# Patient Record
Sex: Female | Born: 1935 | Race: White | Hispanic: No | Smoking: Current every day smoker
Health system: Southern US, Community
[De-identification: ages and names within clinical notes are randomized; demographics above are authoritative.]

## PROBLEM LIST (undated history)

## (undated) DIAGNOSIS — C801 Malignant (primary) neoplasm, unspecified: Secondary | ICD-10-CM

## (undated) DIAGNOSIS — I428 Other cardiomyopathies: Secondary | ICD-10-CM

## (undated) DIAGNOSIS — M1990 Unspecified osteoarthritis, unspecified site: Secondary | ICD-10-CM

## (undated) DIAGNOSIS — I4891 Unspecified atrial fibrillation: Secondary | ICD-10-CM

## (undated) DIAGNOSIS — I2510 Atherosclerotic heart disease of native coronary artery without angina pectoris: Secondary | ICD-10-CM

## (undated) HISTORY — PX: ABDOMINAL HYSTERECTOMY: SHX81

---

## 2001-06-21 ENCOUNTER — Emergency Department (HOSPITAL_COMMUNITY): Admission: EM | Admit: 2001-06-21 | Discharge: 2001-06-21 | Payer: Self-pay | Admitting: Emergency Medicine

## 2001-06-21 ENCOUNTER — Encounter: Payer: Self-pay | Admitting: Emergency Medicine

## 2002-07-21 ENCOUNTER — Encounter: Payer: Self-pay | Admitting: Internal Medicine

## 2002-07-21 ENCOUNTER — Observation Stay (HOSPITAL_COMMUNITY): Admission: EM | Admit: 2002-07-21 | Discharge: 2002-07-21 | Payer: Self-pay | Admitting: *Deleted

## 2003-03-13 ENCOUNTER — Encounter (HOSPITAL_COMMUNITY): Admission: RE | Admit: 2003-03-13 | Discharge: 2003-04-12 | Payer: Self-pay | Admitting: *Deleted

## 2003-07-30 ENCOUNTER — Ambulatory Visit: Admission: RE | Admit: 2003-07-30 | Discharge: 2003-07-30 | Payer: Self-pay | Admitting: Orthopedic Surgery

## 2003-07-30 ENCOUNTER — Encounter: Payer: Self-pay | Admitting: Orthopedic Surgery

## 2003-08-14 ENCOUNTER — Ambulatory Visit (HOSPITAL_COMMUNITY): Admission: RE | Admit: 2003-08-14 | Discharge: 2003-08-14 | Payer: Self-pay | Admitting: Orthopedic Surgery

## 2003-08-14 ENCOUNTER — Encounter: Payer: Self-pay | Admitting: Orthopedic Surgery

## 2006-06-08 ENCOUNTER — Ambulatory Visit: Payer: Self-pay | Admitting: Internal Medicine

## 2006-06-27 ENCOUNTER — Encounter (INDEPENDENT_AMBULATORY_CARE_PROVIDER_SITE_OTHER): Payer: Self-pay | Admitting: Specialist

## 2006-06-27 ENCOUNTER — Ambulatory Visit: Payer: Self-pay | Admitting: Internal Medicine

## 2006-06-27 ENCOUNTER — Ambulatory Visit (HOSPITAL_COMMUNITY): Admission: RE | Admit: 2006-06-27 | Discharge: 2006-06-27 | Payer: Self-pay | Admitting: Internal Medicine

## 2007-03-15 ENCOUNTER — Ambulatory Visit: Payer: Self-pay | Admitting: Orthopedic Surgery

## 2007-08-13 ENCOUNTER — Ambulatory Visit: Payer: Self-pay | Admitting: Orthopedic Surgery

## 2009-06-07 ENCOUNTER — Encounter (INDEPENDENT_AMBULATORY_CARE_PROVIDER_SITE_OTHER): Payer: Self-pay | Admitting: Internal Medicine

## 2009-06-07 ENCOUNTER — Inpatient Hospital Stay (HOSPITAL_COMMUNITY): Admission: EM | Admit: 2009-06-07 | Discharge: 2009-06-13 | Payer: Self-pay | Admitting: Emergency Medicine

## 2009-11-21 HISTORY — PX: TOTAL HIP ARTHROPLASTY: SHX124

## 2010-08-01 ENCOUNTER — Inpatient Hospital Stay (HOSPITAL_COMMUNITY): Admission: EM | Admit: 2010-08-01 | Discharge: 2010-08-11 | Payer: Self-pay | Admitting: Emergency Medicine

## 2010-08-05 ENCOUNTER — Encounter: Payer: Self-pay | Admitting: Orthopedic Surgery

## 2010-08-11 ENCOUNTER — Inpatient Hospital Stay: Admission: AD | Admit: 2010-08-11 | Discharge: 2010-08-13 | Payer: Self-pay | Admitting: Internal Medicine

## 2010-09-21 ENCOUNTER — Encounter (HOSPITAL_COMMUNITY)
Admission: RE | Admit: 2010-09-21 | Discharge: 2010-10-21 | Payer: Self-pay | Source: Home / Self Care | Admitting: Orthopedic Surgery

## 2010-10-06 ENCOUNTER — Ambulatory Visit: Payer: Self-pay | Admitting: Psychiatry

## 2011-02-03 LAB — DIFFERENTIAL
Basophils Absolute: 0 10*3/uL (ref 0.0–0.1)
Basophils Relative: 0 % (ref 0–1)
Basophils Relative: 0 % (ref 0–1)
Eosinophils Absolute: 0.1 10*3/uL (ref 0.0–0.7)
Eosinophils Relative: 0 % (ref 0–5)
Lymphocytes Relative: 9 % — ABNORMAL LOW (ref 12–46)
Lymphocytes Relative: 18 % (ref 12–46)
Lymphs Abs: 1.3 10*3/uL (ref 0.7–4.0)
Lymphs Abs: 1.8 10*3/uL (ref 0.7–4.0)
Lymphs Abs: 2.1 10*3/uL (ref 0.7–4.0)
Monocytes Absolute: 1.1 10*3/uL — ABNORMAL HIGH (ref 0.1–1.0)
Monocytes Relative: 4 % (ref 3–12)
Monocytes Relative: 10 % (ref 3–12)
Neutro Abs: 13.5 10*3/uL — ABNORMAL HIGH (ref 1.7–7.7)
Neutro Abs: 7.9 10*3/uL — ABNORMAL HIGH (ref 1.7–7.7)
Neutro Abs: 8.1 10*3/uL — ABNORMAL HIGH (ref 1.7–7.7)
Neutrophils Relative %: 87 % — ABNORMAL HIGH (ref 43–77)
Neutrophils Relative %: 72 % (ref 43–77)

## 2011-02-03 LAB — BASIC METABOLIC PANEL
BUN: 4 mg/dL — ABNORMAL LOW (ref 6–23)
BUN: 5 mg/dL — ABNORMAL LOW (ref 6–23)
BUN: 5 mg/dL — ABNORMAL LOW (ref 6–23)
BUN: 1 mg/dL — ABNORMAL LOW (ref 6–23)
CO2: 27 mEq/L (ref 19–32)
CO2: 29 mEq/L (ref 19–32)
Calcium: 8.4 mg/dL (ref 8.4–10.5)
Calcium: 8.8 mg/dL (ref 8.4–10.5)
Calcium: 9.1 mg/dL (ref 8.4–10.5)
Calcium: 9.4 mg/dL (ref 8.4–10.5)
Chloride: 93 mEq/L — ABNORMAL LOW (ref 96–112)
Chloride: 95 mEq/L — ABNORMAL LOW (ref 96–112)
Creatinine, Ser: 0.62 mg/dL (ref 0.4–1.2)
Creatinine, Ser: 0.64 mg/dL (ref 0.4–1.2)
Creatinine, Ser: 0.53 mg/dL (ref 0.4–1.2)
GFR calc Af Amer: >60 mL/min (ref >60)
GFR calc Af Amer: >60 mL/min (ref >60)
GFR calc Af Amer: >60 mL/min (ref >60)
GFR calc non Af Amer: >60 mL/min (ref >60)
GFR calc non Af Amer: >60 mL/min (ref >60)
GFR calc non Af Amer: >60 mL/min (ref >60)
GFR calc non Af Amer: >60 mL/min (ref >60)
GFR calc non Af Amer: >60 mL/min (ref >60)
GFR calc non Af Amer: >60 mL/min (ref >60)
GFR calc non Af Amer: >60 mL/min (ref >60)
GFR calc non Af Amer: >60 mL/min (ref >60)
Glucose, Bld: 104 mg/dL — ABNORMAL HIGH (ref 70–99)
Glucose, Bld: 139 mg/dL — ABNORMAL HIGH (ref 70–99)
Potassium: 3.7 mEq/L (ref 3.5–5.1)
Potassium: 4.1 mEq/L (ref 3.5–5.1)
Potassium: 4.1 mEq/L (ref 3.5–5.1)
Potassium: 4.3 mEq/L (ref 3.5–5.1)
Potassium: 4.9 mEq/L (ref 3.5–5.1)
Potassium: 3.2 mEq/L — ABNORMAL LOW (ref 3.5–5.1)
Sodium: 127 mEq/L — ABNORMAL LOW (ref 135–145)
Sodium: 128 mEq/L — ABNORMAL LOW (ref 135–145)
Sodium: 129 mEq/L — ABNORMAL LOW (ref 135–145)
Sodium: 129 mEq/L — ABNORMAL LOW (ref 135–145)
Sodium: 132 mEq/L — ABNORMAL LOW (ref 135–145)
Sodium: 132 mEq/L — ABNORMAL LOW (ref 135–145)
Sodium: 132 mEq/L — ABNORMAL LOW (ref 135–145)

## 2011-02-03 LAB — DIGOXIN LEVEL: Digoxin Level: <0.2 ng/mL — ABNORMAL LOW (ref 0.8–2.0)

## 2011-02-03 LAB — CBC
HCT: 32.1 % — ABNORMAL LOW (ref 36.0–46.0)
HCT: 33.4 % — ABNORMAL LOW (ref 36.0–46.0)
HCT: 34 % — ABNORMAL LOW (ref 36.0–46.0)
HCT: 35.7 % — ABNORMAL LOW (ref 36.0–46.0)
HCT: 37.7 % (ref 36.0–46.0)
HCT: 43.1 % (ref 36.0–46.0)
HCT: 39.9 % (ref 36.0–46.0)
Hemoglobin: 11.8 g/dL — ABNORMAL LOW (ref 12.0–15.0)
Hemoglobin: 12.2 g/dL (ref 12.0–15.0)
Hemoglobin: 12.3 g/dL (ref 12.0–15.0)
Hemoglobin: 13.3 g/dL (ref 12.0–15.0)
Hemoglobin: 14.8 g/dL (ref 12.0–15.0)
Hemoglobin: 14 g/dL (ref 12.0–15.0)
MCH: 32.9 pg (ref 26.0–34.0)
MCH: 33.3 pg (ref 26.0–34.0)
MCHC: 35.3 g/dL (ref 30.0–36.0)
MCHC: 34.4 g/dL (ref 30.0–36.0)
MCHC: 35.1 g/dL (ref 30.0–36.0)
MCV: 92.3 fL (ref 78.0–100.0)
MCV: 93.9 fL (ref 78.0–100.0)
MCV: 94.9 fL (ref 78.0–100.0)
Platelets: 456 10*3/uL — ABNORMAL HIGH (ref 150–400)
Platelets: 476 10*3/uL — ABNORMAL HIGH (ref 150–400)
Platelets: 219 10*3/uL (ref 150–400)
RBC: 3.56 MIL/uL — ABNORMAL LOW (ref 3.87–5.11)
RBC: 3.62 MIL/uL — ABNORMAL LOW (ref 3.87–5.11)
RBC: 3.8 MIL/uL — ABNORMAL LOW (ref 3.87–5.11)
RBC: 4.41 MIL/uL (ref 3.87–5.11)
RBC: 4.32 MIL/uL (ref 3.87–5.11)
RDW: 12.3 % (ref 11.5–15.5)
RDW: 12.5 % (ref 11.5–15.5)
RDW: 12.7 % (ref 11.5–15.5)
RDW: 12.7 % (ref 11.5–15.5)
WBC: 10.3 10*3/uL (ref 4.0–10.5)
WBC: 11.3 10*3/uL — ABNORMAL HIGH (ref 4.0–10.5)
WBC: 11.7 10*3/uL — ABNORMAL HIGH (ref 4.0–10.5)
WBC: 6.6 10*3/uL (ref 4.0–10.5)
WBC: 7.9 10*3/uL (ref 4.0–10.5)
WBC: 8.7 10*3/uL (ref 4.0–10.5)
WBC: 15.5 10*3/uL — ABNORMAL HIGH (ref 4.0–10.5)

## 2011-02-03 LAB — PROTIME-INR
INR: 1 (ref 0.00–1.49)
INR: 1.08 (ref 0.00–1.49)
INR: 1.16 (ref 0.00–1.49)
INR: 1.21 (ref 0.00–1.49)
INR: 1.22 (ref 0.00–1.49)
INR: 0.98 (ref 0.00–1.49)
Prothrombin Time: 14.2 seconds (ref 11.6–15.2)
Prothrombin Time: 15 seconds (ref 11.6–15.2)
Prothrombin Time: 15.6 seconds — ABNORMAL HIGH (ref 11.6–15.2)
Prothrombin Time: 18.8 seconds — ABNORMAL HIGH (ref 11.6–15.2)

## 2011-02-03 LAB — URINE MICROSCOPIC-ADD ON

## 2011-02-03 LAB — BLOOD GAS, ARTERIAL
Bicarbonate: 22.3 mEq/L (ref 20.0–24.0)
O2 Saturation: 94.3 %
Patient temperature: 37
pH, Arterial: 7.417 — ABNORMAL HIGH (ref 7.350–7.400)

## 2011-02-03 LAB — URINALYSIS, MICROSCOPIC ONLY
Bilirubin Urine: NEGATIVE
Glucose, UA: NEGATIVE mg/dL
Hgb urine dipstick: NEGATIVE
Ketones, ur: 15 mg/dL — AB
Protein, ur: NEGATIVE mg/dL
pH: 7.5 (ref 5.0–8.0)

## 2011-02-03 LAB — COMPREHENSIVE METABOLIC PANEL
ALT: 17 U/L (ref 0–35)
AST: 22 U/L (ref 0–37)
Albumin: 3.1 g/dL — ABNORMAL LOW (ref 3.5–5.2)
Alkaline Phosphatase: 69 U/L (ref 39–117)
BUN: 2 mg/dL — ABNORMAL LOW (ref 6–23)
BUN: 3 mg/dL — ABNORMAL LOW (ref 6–23)
CO2: 25 mEq/L (ref 19–32)
Calcium: 8.5 mg/dL (ref 8.4–10.5)
Calcium: 8.8 mg/dL (ref 8.4–10.5)
Creatinine, Ser: 0.59 mg/dL (ref 0.4–1.2)
GFR calc Af Amer: >60 mL/min (ref >60)
GFR calc non Af Amer: >60 mL/min (ref >60)
Glucose, Bld: 156 mg/dL — ABNORMAL HIGH (ref 70–99)
Sodium: 129 mEq/L — ABNORMAL LOW (ref 135–145)
Total Protein: 5.9 g/dL — ABNORMAL LOW (ref 6.0–8.3)

## 2011-02-03 LAB — CROSSMATCH: Antibody Screen: NEGATIVE

## 2011-02-03 LAB — ABO/RH
ABO/RH(D): A POS
ABO/RH(D): A POS

## 2011-02-03 LAB — MAGNESIUM
Magnesium: 1.6 mg/dL (ref 1.5–2.5)
Magnesium: 1.6 mg/dL (ref 1.5–2.5)

## 2011-02-03 LAB — HEPATIC FUNCTION PANEL
ALT: 33 U/L (ref 0–35)
AST: 43 U/L — ABNORMAL HIGH (ref 0–37)
Albumin: 3.9 g/dL (ref 3.5–5.2)
Total Bilirubin: 1.3 mg/dL — ABNORMAL HIGH (ref 0.3–1.2)

## 2011-02-03 LAB — URINALYSIS, ROUTINE W REFLEX MICROSCOPIC
Bilirubin Urine: NEGATIVE
Glucose, UA: NEGATIVE mg/dL
Specific Gravity, Urine: 1.01 (ref 1.005–1.030)
pH: 7.5 (ref 5.0–8.0)

## 2011-02-03 LAB — URIC ACID: Uric Acid, Serum: 2.5 mg/dL (ref 2.4–7.0)

## 2011-02-03 LAB — TSH: TSH: 1.731 u[IU]/mL (ref 0.350–4.500)

## 2011-02-03 LAB — APTT: aPTT: 26 seconds (ref 24–37)

## 2011-02-03 LAB — URINE CULTURE
Colony Count: NO GROWTH
Culture  Setup Time: 201109150839

## 2011-02-03 LAB — OSMOLALITY, URINE: Osmolality, Ur: 320 mOsm/kg — ABNORMAL LOW (ref 390–1090)

## 2011-02-03 LAB — CREATININE, URINE, RANDOM: Creatinine, Urine: 16.8 mg/dL

## 2011-02-03 LAB — OSMOLALITY: Osmolality: 263 mOsm/kg — ABNORMAL LOW (ref 275–300)

## 2011-02-27 LAB — CBC
HCT: 37.8 % (ref 36.0–46.0)
HCT: 42.7 % (ref 36.0–46.0)
Hemoglobin: 14.8 g/dL (ref 12.0–15.0)
Hemoglobin: 15.8 g/dL — ABNORMAL HIGH (ref 12.0–15.0)
MCHC: 34.5 g/dL (ref 30.0–36.0)
MCHC: 34.7 g/dL (ref 30.0–36.0)
MCHC: 34.9 g/dL (ref 30.0–36.0)
MCV: 106.3 fL — ABNORMAL HIGH (ref 78.0–100.0)
Platelets: 162 10*3/uL (ref 150–400)
Platelets: 182 10*3/uL (ref 150–400)
Platelets: 191 K/uL (ref 150–400)
RBC: 3.34 MIL/uL — ABNORMAL LOW (ref 3.87–5.11)
RBC: 4.01 MIL/uL (ref 3.87–5.11)
RBC: 4.3 MIL/uL (ref 3.87–5.11)
RDW: 12.8 % (ref 11.5–15.5)
RDW: 12.8 % (ref 11.5–15.5)
WBC: 6.1 K/uL (ref 4.0–10.5)
WBC: 6.8 10*3/uL (ref 4.0–10.5)
WBC: 9.8 10*3/uL (ref 4.0–10.5)

## 2011-02-27 LAB — BASIC METABOLIC PANEL WITH GFR
BUN: 6 mg/dL (ref 6–23)
BUN: 8 mg/dL (ref 6–23)
CO2: 25 meq/L (ref 19–32)
CO2: 26 meq/L (ref 19–32)
Calcium: 9.2 mg/dL (ref 8.4–10.5)
Calcium: 9.4 mg/dL (ref 8.4–10.5)
Chloride: 101 meq/L (ref 96–112)
Chloride: 99 meq/L (ref 96–112)
Creatinine, Ser: 0.76 mg/dL (ref 0.4–1.2)
Creatinine, Ser: 0.76 mg/dL (ref 0.4–1.2)
GFR calc non Af Amer: >60 mL/min
GFR calc non Af Amer: >60 mL/min
Glucose, Bld: 101 mg/dL — ABNORMAL HIGH (ref 70–99)
Glucose, Bld: 92 mg/dL (ref 70–99)
Potassium: 5.1 meq/L (ref 3.5–5.1)
Potassium: 5.4 meq/L — ABNORMAL HIGH (ref 3.5–5.1)
Sodium: 133 meq/L — ABNORMAL LOW (ref 135–145)
Sodium: 133 meq/L — ABNORMAL LOW (ref 135–145)

## 2011-02-27 LAB — POCT I-STAT 3, VENOUS BLOOD GAS (G3P V)
O2 Saturation: 72 %
pCO2, Ven: 36.3 mmHg — ABNORMAL LOW (ref 45.0–50.0)

## 2011-02-27 LAB — BRAIN NATRIURETIC PEPTIDE
Pro B Natriuretic peptide (BNP): 450 pg/mL — ABNORMAL HIGH (ref 0.0–100.0)
Pro B Natriuretic peptide (BNP): 524 pg/mL — ABNORMAL HIGH (ref 0.0–100.0)
Pro B Natriuretic peptide (BNP): 579 pg/mL — ABNORMAL HIGH (ref 0.0–100.0)
Pro B Natriuretic peptide (BNP): 625 pg/mL — ABNORMAL HIGH (ref 0.0–100.0)

## 2011-02-27 LAB — POCT I-STAT 3, ART BLOOD GAS (G3+)
Acid-Base Excess: 4 mmol/L — ABNORMAL HIGH (ref 0.0–2.0)
Bicarbonate: 26.2 meq/L — ABNORMAL HIGH (ref 20.0–24.0)
Bicarbonate: 26.7 mEq/L — ABNORMAL HIGH (ref 20.0–24.0)
O2 Saturation: 96 %
O2 Saturation: 96 %
TCO2: 27 mmol/L (ref 0–100)
pCO2 arterial: 33.1 mmHg — ABNORMAL LOW (ref 35.0–45.0)
pCO2 arterial: 33.5 mmHg — ABNORMAL LOW (ref 35.0–45.0)
pH, Arterial: 7.463 — ABNORMAL HIGH (ref 7.350–7.400)
pH, Arterial: 7.502 — ABNORMAL HIGH (ref 7.350–7.400)
pH, Arterial: 7.509 — ABNORMAL HIGH (ref 7.350–7.400)
pO2, Arterial: 70 mmHg — ABNORMAL LOW (ref 80.0–100.0)
pO2, Arterial: 74 mmHg — ABNORMAL LOW (ref 80.0–100.0)

## 2011-02-27 LAB — COMPREHENSIVE METABOLIC PANEL
ALT: 21 U/L (ref 0–35)
ALT: 25 U/L (ref 0–35)
ALT: 38 U/L — ABNORMAL HIGH (ref 0–35)
AST: 44 U/L — ABNORMAL HIGH (ref 0–37)
Albumin: 2.8 g/dL — ABNORMAL LOW (ref 3.5–5.2)
Albumin: 3.4 g/dL — ABNORMAL LOW (ref 3.5–5.2)
Alkaline Phosphatase: 71 U/L (ref 39–117)
Alkaline Phosphatase: 74 U/L (ref 39–117)
Alkaline Phosphatase: 90 U/L (ref 39–117)
CO2: 27 mEq/L (ref 19–32)
Calcium: 8.2 mg/dL — ABNORMAL LOW (ref 8.4–10.5)
Chloride: 95 mEq/L — ABNORMAL LOW (ref 96–112)
GFR calc Af Amer: >60 mL/min (ref >60)
GFR calc Af Amer: >60 mL/min (ref >60)
GFR calc non Af Amer: >60 mL/min (ref >60)
Potassium: 2.8 mEq/L — ABNORMAL LOW (ref 3.5–5.1)
Potassium: 3.2 mEq/L — ABNORMAL LOW (ref 3.5–5.1)
Potassium: 4.6 mEq/L (ref 3.5–5.1)
Sodium: 126 mEq/L — ABNORMAL LOW (ref 135–145)
Sodium: 133 mEq/L — ABNORMAL LOW (ref 135–145)
Sodium: 133 mEq/L — ABNORMAL LOW (ref 135–145)
Total Protein: 6.1 g/dL (ref 6.0–8.3)
Total Protein: 6.2 g/dL (ref 6.0–8.3)

## 2011-02-27 LAB — BASIC METABOLIC PANEL
Calcium: 8.7 mg/dL (ref 8.4–10.5)
Chloride: 93 mEq/L — ABNORMAL LOW (ref 96–112)
Chloride: 96 mEq/L (ref 96–112)
GFR calc Af Amer: >60 mL/min (ref >60)
GFR calc Af Amer: >60 mL/min (ref >60)
GFR calc non Af Amer: >60 mL/min (ref >60)
GFR calc non Af Amer: >60 mL/min (ref >60)
Potassium: 3.2 mEq/L — ABNORMAL LOW (ref 3.5–5.1)
Potassium: 4 mEq/L (ref 3.5–5.1)
Sodium: 131 mEq/L — ABNORMAL LOW (ref 135–145)
Sodium: 135 mEq/L (ref 135–145)

## 2011-02-27 LAB — COMPREHENSIVE METABOLIC PANEL WITH GFR
ALT: 22 U/L (ref 0–35)
AST: 40 U/L — ABNORMAL HIGH (ref 0–37)
Albumin: 3.2 g/dL — ABNORMAL LOW (ref 3.5–5.2)
Alkaline Phosphatase: 70 U/L (ref 39–117)
BUN: 8 mg/dL (ref 6–23)
CO2: 24 meq/L (ref 19–32)
Calcium: 9.2 mg/dL (ref 8.4–10.5)
Chloride: 97 meq/L (ref 96–112)
Creatinine, Ser: 0.68 mg/dL (ref 0.4–1.2)
GFR calc non Af Amer: >60 mL/min
Glucose, Bld: 101 mg/dL — ABNORMAL HIGH (ref 70–99)
Potassium: 4.6 meq/L (ref 3.5–5.1)
Sodium: 130 meq/L — ABNORMAL LOW (ref 135–145)
Total Bilirubin: 1.2 mg/dL (ref 0.3–1.2)
Total Protein: 6.7 g/dL (ref 6.0–8.3)

## 2011-02-27 LAB — PREALBUMIN: Prealbumin: 11.4 mg/dL — ABNORMAL LOW (ref 18.0–45.0)

## 2011-02-27 LAB — BLOOD GAS, ARTERIAL
Bicarbonate: 25.6 mEq/L — ABNORMAL HIGH (ref 20.0–24.0)
Patient temperature: 98.6
pH, Arterial: 7.483 — ABNORMAL HIGH (ref 7.350–7.400)

## 2011-02-27 LAB — MAGNESIUM
Magnesium: 2.1 mg/dL (ref 1.5–2.5)
Magnesium: 2.3 mg/dL (ref 1.5–2.5)

## 2011-02-27 LAB — GLUCOSE, CAPILLARY
Glucose-Capillary: 131 mg/dL — ABNORMAL HIGH (ref 70–99)
Glucose-Capillary: 93 mg/dL (ref 70–99)

## 2011-02-27 LAB — LIPID PANEL
LDL Cholesterol: 111 mg/dL — ABNORMAL HIGH (ref 0–99)
Total CHOL/HDL Ratio: 3 RATIO
VLDL: 18 mg/dL (ref 0–40)

## 2011-02-27 LAB — DIFFERENTIAL
Basophils Relative: 1 % (ref 0–1)
Eosinophils Absolute: 0 10*3/uL (ref 0.0–0.7)
Monocytes Absolute: 0.7 10*3/uL (ref 0.1–1.0)
Monocytes Relative: 7 % (ref 3–12)
Neutro Abs: 6.2 10*3/uL (ref 1.7–7.7)

## 2011-02-27 LAB — CK TOTAL AND CKMB (NOT AT ARMC)
CK, MB: 2.7 ng/mL (ref 0.3–4.0)
CK, MB: 4.1 ng/mL — ABNORMAL HIGH (ref 0.3–4.0)
CK, MB: 5.3 ng/mL — ABNORMAL HIGH (ref 0.3–4.0)
Relative Index: 4 — ABNORMAL HIGH (ref 0.0–2.5)
Total CK: 105 U/L (ref 7–177)
Total CK: 89 U/L (ref 7–177)

## 2011-02-27 LAB — POCT CARDIAC MARKERS: CKMB, poc: 2.3 ng/mL (ref 1.0–8.0)

## 2011-02-27 LAB — TROPONIN I
Troponin I: 0.13 ng/mL — ABNORMAL HIGH (ref 0.00–0.06)
Troponin I: 0.17 ng/mL — ABNORMAL HIGH (ref 0.00–0.06)

## 2011-02-27 LAB — PROTIME-INR: INR: 1.1 (ref 0.00–1.49)

## 2011-02-27 LAB — HEMOGLOBIN A1C: Hgb A1c MFr Bld: 5.3 % (ref 4.6–6.1)

## 2011-02-27 LAB — POTASSIUM: Potassium: 4.1 mEq/L (ref 3.5–5.1)

## 2011-02-27 LAB — AMMONIA: Ammonia: 50 umol/L — ABNORMAL HIGH (ref 11–35)

## 2011-04-05 NOTE — Cardiovascular Report (Signed)
NAMECAMESHIA, CRESSMAN NO.:  [id number]   MEDICAL RECORD NO.:  [id number]          PATIENT TYPE:  INP   LOCATION:  2920                         FACILITY:  [facility]   PHYSICIAN:  Nicki Guadalajara, M.D.     DATE OF BIRTH:  11/05/1935   DATE OF PROCEDURE:  DATE OF DISCHARGE:                            CARDIAC CATHETERIZATION   INDICATIONS:  Ms. [name] is a 76 year old female who has been  admitted with increasing episodes of shortness of breath.  Remotely, an  echo in 2004 showed LVH with normal LV function.  The patient has a  significant history of tobacco use as well as alcohol.  She has had  atrial fibrillation with congestive heart failure and has been  vigorously diuresed.  She now is referred by Dr. Lynnea Ferrier for right and  left heart cardiac catheterization.   PROCEDURE:  After premedication with Valium 5 mg intravenously, the  patient was prepped and draped in usual fashion.  Her right femoral  artery was punctured anteriorly and a 5-French arterial sheath was  inserted.  A 7-French venous sheath was inserted in the right femoral  vein.  Swan-Ganz catheterization was done via the femoral venous sheath  with advancement of the Swan-Ganz catheter to the RA, RV, PA, and PC  positions.  Cardiac output was obtained by thermodilution method as well  as by the Fick method.  A pigtail catheter was advanced, but there was  difficulty in traversing very calcified segment in the aorta.  Ultimately, a Glidewire was necessary in order to traverse this apparent  distal aortic stenosis.  Simultaneous AO and PA pressures were recorded.  The pigtail catheter was advanced into the left ventricle and  simultaneous left ventricular and pulmonary capillary wedge pressures  were recorded.  Biplane cine left ventriculography was then performed.  Due to the significantly calcified distal aortic region with high  suspicion for distal aortic stenosis, distal aortography was  performed.  Attention was then directed at the coronary arteries and diagnostic  coronary angiography was performed utilizing 5-French Judkins for left  and right coronary catheters.  The patient tolerated the procedure well.  Hemostasis was obtained by direct manual pressure.   HEMODYNAMIC DATA:  Right atrial pressure, mean 4, A-wave 65.  Right  ventricle pressure 21/4.  Pulmonary artery pressure 21/10.  Pulmonary  capillary wedge pressure 10.  Central aortic pressure 130/60.  Left  ventricular pressure 130/9.   Oxygen saturation in pulmonary artery was 72% and the central aorta was  96%.   Cardiac output by the thermodilution method was 2.5 liters per minute  and by the Fick method, it was 5.1 liters per minute.  Cardiac index was  1.6 and 2.5 liters per minute per meter squared respectively.   Biplane cineventriculography revealed severe global left ventricular  dysfunction.  In the RAO projection, ejection fraction approximately  25%.  In the LAO projection, the ejection fraction approximately 25-30%.   Distal aortography revealed patent renal arteries.  There was calcified  distal aorta proximal to the iliac bifurcation.  There was focal 90%  stenosis within this calcified segment  in the distal aorta.  There was  mild 20% bilateral common iliac narrowings.   ANGIOGRAPHIC DATA:  Left main coronary was angiographically normal and  bifurcated into an LAD and left circumflex system.   The LAD gave rise to a proximal diagonal vessel.  There was mild 20%  narrowing in the diagonal vessel.  Most views of the LAD after the  diagonal vessel suggested fairly normal-appearing vessel with narrowing  perhaps of 20-30%.  However, on one view, there was a suggestion of 50%  eccentric narrowing in this segment beyond the diagonal vessel.   The right coronary artery was a moderate size vessel that gave rise to a  PDA.  There was 30% narrowing in the proximal-to-mid segment.   IMPRESSION:   1. Nonischemic cardiomyopathy with an ejection fraction of      approximately 25 to less than 30%.  2. Significant distal aortic focal calcification with narrowing of 90%      and 20% iliac narrowings.  3. Mild coronary obstructive disease with 20% narrowing in the first      diagonal branch, proximally 30 to less than 50% narrowing in the      mid left anterior descending; and 30% narrowing in the right      coronary artery.   DISCUSSION:  Ms. Wisniewski has LV dysfunction at a proportion to her mild  coronary obstructive disease.  She will be aggressively managed  medically and attempt to improve LV function.  She will require  cessation of alcohol and tobacco.  Another angiographic study will be  reviewed for possible peripheral vascular intervention of her distal  aorta.           ______________________________  Nicki Guadalajara, M.D.     TK/MEDQ  D:  06/11/2009  T:  06/12/2009  Job:  [id number]   cc:   Ritta Slot, MD  Vania Rea, M.D.

## 2011-04-05 NOTE — H&P (Signed)
[name], [name] NO.:  [id number]   MEDICAL RECORD NO.:  [id number]          PATIENT TYPE:  INP   LOCATION:  2920                         FACILITY:  [facility]   PHYSICIAN:  Vania Rea, M.D. DATE OF BIRTH:  12/22/1934   DATE OF ADMISSION:  06/07/2009  DATE OF DISCHARGE:                              HISTORY & PHYSICAL   PRIMARY CARE PHYSICIAN:  Unassigned.   CHIEF COMPLAINT:  Sudden onset of shortness of breath x1 day.   HISTORY OF PRESENT ILLNESS:  This is a 76 year old Caucasian lady who  admits to heavy alcohol and tobacco abuse.  Says she lives alone and has  nothing to do.  Does not visit a primary care physician regularly but  yesterday developed sudden onset of acute shortness of breath without  chest pain, lightheadedness or palpitations, and eventually called EMS  and was brought to the emergency room at [facility] to be evaluated.  The patient was found to be in atrial fibrillation with rapid  ventricular response and the hospitalist service was called to assist  with management.   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  However, she is never been diagnosed with atrial  fibrillation.  She is never been diagnosed with congestive heart  failure.   PAST MEDICAL HISTORY:  Diverticulitis, otherwise none.   MEDICATIONS:  None.   ALLERGIES:  PENICILLIN causes a rash.   SOCIAL HISTORY:  Smokes 2-1/2 packs of cigarettes per day for many  years.  Drinks a lot of bourbon but does not measure the amount.  She  has nothing else to do.  She is widowed, lives alone.   FAMILY HISTORY:  Significant for Alzheimer's and cancers.   REVIEW OF SYSTEMS:  Other than noted above, a 10-point review of systems  is unremarkable.   PHYSICAL EXAMINATION:  Elderly Caucasian lady  reclining in the  stretcher, somewhat tachypneic.  VITALS:  Her temperature is not yet taken, her pulse was initially 161.  She has received a total of 20 mg of Cardizem by bolus and her heart  rate is now about 110, but remains irregular.  Respiratory rate is 24.  She is saturating at 94% on 2 liters.  Her pupils are round and equal.  Mucous membranes pink.  Anicteric.  No cervical lymphadenopathy or  thyromegaly.  She has distended external jugular veins.  She has diminished breath sounds in the bases.  She is tachypneic.  CARDIOVASCULAR SYSTEM:  She has an irregular irregular rhythm.  No  murmur.  ABDOMEN:  Obese, soft and nontender.  No masses.  EXTREMITIES:  Without edema.  She has 2+ dorsalis pedis pulses  bilaterally.  SKIN:  Has a slight yellowish tinge but is without rash or ulcers.  CENTRAL NERVOUS SYSTEM: Cranial nerves II-XII are grossly intact and she  has no focal neurologic deficit.   LABORATORY DATA:  Her white count is 9.8, hemoglobin  13.9, MCV 106.5,  platelets 182.  She has a normal differential.  Her sodium is 126,  potassium 3.2, chloride 89, CO2 24, glucose 215, BUN 3, creatinine 0.99,  calcium 8.7, total protein 6.2, albumin 3.4, AST 71, ALT 38, alk phos is  98, total bilirubin is 1.9.  Her myoglobin is 98, CK-MB 2.3, troponin is  undetectable.  Beta type natriuretic peptide is 883.  Chest x-ray shows  pulmonary edema with left-sided effusion.  A CT scan of her chest shows  pulmonary edema, bilateral pleural effusions and multiple lung nodules  but no lymphadenopathy and a nodule in the adrenal gland.   ASSESSMENT:  1. Atrial fibrillation with rapid ventricular response, questionable      new.  2. Acute decompensated congestive heart failure.  3. Tobacco abuse.  4. Alcohol abuse.  5. Abnormal liver functions probable alcoholic liver disease given her      AST, ALT ratio and her mild macrocytosis.  6. Diabetes type 2 given her random glucose greater than  200.   PLAN:  Agree with intravenous Lasix and potassium replacement. Will  start her on Lasix and spironolactone.  Because of her blood pressure  issues, will start a digoxin load, but also start low dose Cardizem drip  with parameters.  Will replace B vitamins and put her on CIWA protocol  and give her a nicotine replacement patch as well as continue  counseling. Will check her thyroid function tests, her hemoglobin A1c,  her serum coags, her RPR and a serum magnesium.  Other plans as per  orders.      Vania Rea, M.D.  Electronically Signed     LC/MEDQ  D:  06/07/2009  T:  06/07/2009  Job:  [id number]

## 2011-04-08 NOTE — Consult Note (Signed)
NAMEISZABELLA, [name]              ACCOUNT NO.:  [id number]   MEDICAL RECORD NO.:  [id number]          PATIENT TYPE:  AMB   LOCATION:                                FACILITY:  [facility]   PHYSICIAN:  Lionel December, M.D.    DATE OF BIRTH:  10/30/1935   DATE OF CONSULTATION:  06/08/2006  DATE OF DISCHARGE:                                   CONSULTATION   REASON FOR CONSULTATION:  Recent episode of diverticulitis.  The patient  referred for colonoscopy.   INDICATIONS:  [name] is a 76 year old Caucasian female who is referred  through courtesy of Madelin Rear. Sherwood Gambler, M.D. for GI evaluation.  She  presented to him about 5 weeks ago with lower abdominal discomfort with  bloating and bowel urgency and all she was passing was bits and pieces of  stools.  She had abdominopelvic CT on 05/04/2006.  Revealed sigmoid colon  diverticulosis and focal wall thickening with stranding and edema in the mid  sigmoid colon consistent with diverticulitis.  She also had left adrenal  mass felt to be an adenoma.  She had nonobstructing right renal calculi  which were 1-2 mm in maximal diameter.  The patient was given Cipro and  Flagyl.  She stopped the medicine after 4 days because it made her dizzy and  she could not drive.  She states that she would not eat regular meals but  since this episode she has started to eat regular meals and she has felt  better. Now she is having regular or what she describes to be normal bowel  movements.  She has a good appetite.  She denies melena or rectal bleeding,  heartburn, dysphagia, nausea or vomiting.  She has not lost any weight  recently.   She does not take any medicines on a regular basis.   PAST MEDICAL HISTORY:  She was seen back in October 1985 by Dr. Welton Flakes for  irregular bowel movements.  She had a normal flexible sigmoidoscopy and  barium enema and she was treated for IBS.   I saw [name] in March 1995 for symptoms of GERD and responded to short  course of acid  suppression.   She had hysterectomy at age 35 for what appears to be some type of early  malignancy or premalignant lesion.   ALLERGIES:  To PENICILLIN which caused rash.   FAMILY HISTORY:  Mother lived to be 75 and father died at 43 of CVA.  She  has a twin sister with what appears to be AST and has not had any problems.  Another sister and two brothers are in good health.  One brother died in his  early 43s of colon carcinoma about 2 years ago.   SOCIAL HISTORY:  She is single and she does not have any children.  She is  retired.  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  She has been smoking a  pack a day for the last 40 years. She drinks alcohol maybe two to  three  drinks a week.   PHYSICAL EXAM:  GENERAL:  Pleasant well-developed, well-nourished Caucasian  female who is in no acute distress.  She weighs 154 pounds.  She is 5 feet 4-  1/2 inches tall.  VITAL SIGNS:  Pulse 80 per minute, blood pressure 160/92, temperature is 98.  HEENT:  Conjunctivae is pink.  Sclerae is nonicteric.  Oropharynx mucosa is  normal.  She has a partial upper plate.  No neck masses or thyromegaly  noted.  HEART:  Cardiac exam with regular rhythm.  Normal S1-S3.  No murmur or  gallop noted.  LUNGS:  Clear to auscultation.  ABDOMEN:  Symmetrical. Bowel sounds are hyperactive.  On palpation is soft  and nontender without organomegaly or masses.  RECTAL:  Examination deferred.  She will have one at the time of  colonoscopy.  EXTREMITIES:  No clubbing or edema noted.   ASSESSMENT:  [name] is a 76 year old Caucasian female who was recently  diagnosed with diverticulitis based on typical CT findings.  She took Cipro  and Flagyl only for 4 days but her symptoms have completely resolved.  She  has never been screened for colorectal carcinoma.  She gives history of  colon carcinoma in a brother in which case it makes it even more  important  for colon to be examined.   RECOMMENDATIONS:  Colonoscopy to be performed at a place in the future.  I  have reviewed the procedure risks with the patient and she is agreeable.  Further recommendations regarding dietary changes, etc., will be made  depending on colonoscopic findings.   I would also like for her to double check her brother's records to make sure  he indeed had colon carcinoma and not another malignancy.   ADDENDUM:  Also reviewed labs from 05/03/2006. CBC was normal except WBC was  10.8 and comprehensive chemistry panel is normal and a TSH was 1.577.   We would like to thank Dr. Sherwood Gambler for the opportunity to participate in the  care of this nice lady.      Lionel December, M.D.  Electronically Signed     NR/MEDQ  D:  06/08/2006  T:  06/09/2006  Job:  [id number]   cc:   Madelin Rear. Sherwood Gambler, MD  Fax: [phone]

## 2011-04-08 NOTE — Procedures (Signed)
   NAMEJERLISA, [name]                        ACCOUNT NO.:  [id number]   MEDICAL RECORD NO.:  [id number]                   PATIENT TYPE:  EMS   LOCATION:  ED                                   FACILITY:  [facility]   PHYSICIAN:  Edward L. Juanetta Gosling, M.D.             DATE OF BIRTH:  03/07/1935   DATE OF PROCEDURE:  07/21/2002  DATE OF DISCHARGE:  07/21/2002                                EKG INTERPRETATION   DATE AND TIME OF TEST:  July 21, 2002 at 0004.   IMPRESSION:  The rhythm is sinus rhythm with a rate in the 70s.  There are Q  waves inferiorly which could indicate a previous inferior infarction; there  are also some T wave changes associated with that.  There are T wave changes  in V1, V2, and V3 as well.  Abnormal electrocardiogram.                                               Oneal Deputy. Juanetta Gosling, M.D.    ELH/MEDQ  D:  08/26/2002  T:  08/28/2002  Job:  [id number]

## 2011-04-08 NOTE — Discharge Summary (Signed)
[name], [name] NO.:  [id number]   MEDICAL RECORD NO.:  [id number]          PATIENT TYPE:  INP   LOCATION:  4706                         FACILITY:  [facility]   PHYSICIAN:  Ritta Slot, MD     DATE OF BIRTH:  June 01, 1935   DATE OF ADMISSION:  06/07/2009  DATE OF DISCHARGE:  06/13/2009                               DISCHARGE SUMMARY   DISCHARGE DIAGNOSES:  1. Acute shortness of breath, acute on chronic systolic heart failure      secondary to nonischemic cardiomyopathy.  2. Nonischemic cardiomyopathy secondary most likely to alcohol use.  3. Paroxysmal atrial fibrillation, now maintaining sinus rhythm.  4. Tobacco abuse.  5. Alcohol abuse.  6. Elevated troponin secondary to congestive heart failure.  7. Protein malnutrition.  8. Acute systolic heart failure.  9. Mild skin breakdown on buttocks.  10.Elevated mean cell volume.  11.Noncompliance.   DISCHARGE CONDITION:  Stable.   PROCEDURES:  None.   DISCHARGE MEDICATIONS:  1. Thiamine 100 mg daily.  2. Folic acid 1 mg daily.  3. Protonix 40 mg daily.  4. Lanoxin 0.125 mg one daily.  5. Zocor 20 mg one daily at bedtime.  6. Furosemide 20 mg one daily in the morning.  7. Coreg 6.25 mg one twice a day.  8. Baby aspirin 2 of them daily.  9. Xopenex inhaler 2 puffs four times a day as needed.  10.Atrovent 2 puffs four times a day as needed.  11.Lisinopril 5 mg one daily.  12.Ensure Vanilla twice a day.  13.Nicotine patch, remove old and apply new daily if you are not      smoking.  Do not smoke and wear patch because it can cause more      heart problems.   DISCHARGE INSTRUCTIONS:  1. Increase activity slowly.  2. May shower.  3. No lifting for 1 week.  No driving for 3 days.  4. Low-sodium heart-healthy diet.  5. Stop tobacco, stop alcohol, no added salt.  6. Wash cath site with soap and water.  Call if any bleeding,      swelling, or drainage.  7. Follow up with Dr. Lynnea Ferrier at [facility].  8. The patient received pneumonia vaccine on June 12, 2009.  9. The office will schedule an outpatient stress test.  We will call      you.  10.If you have hard time to record your daily weight, review special      instructions.   PROCEDURES:  Cardiac catheterization on June 11, 2009, by Dr. Nicki Guadalajara with patent coronary arteries in that minimal 30% stenosis of RCA,  LAD, and diagonal 1.  She does have peripheral vascular disease with 90%  distal aortic stenosis.  EF was also found to be less than or equal to  25-30%, not felt to be ischemic.   HOSPITAL COURSE:  Ms. [name] is a 76 year old patient who do not  have a primary care physician presented to the emergency room on June 07, 2009, and was admitted by the [facility] Physician secondary  to AFib, rapid  ventricular response, and dyspnea secondary to congestive  heart failure, history of gastroesophageal reflux disease, and history  of diverticulosis.  She has had a hysterectomy, also alcohol intake, a  fifth of bourbon daily that she sips.  She is a smoker as well, 2-1/2  packs per day.  She states she gets very bored and smokes and drinks.  She was admitted by [facility] and [facility] and  [facility] was consulted and IV Cardizem was started, and the patient did  convert to a sinus rhythm.  She was on IV heparin, but with her strong  history of noncompliance, Coumadin was not felt to be adequate for this  patient.   While the patient was hospitalized, we did nutrition assessment twice.  She does have a poor appetite, and she has a moderate protein calorie  malnutrition.  Ensure was added.  After her cardiac catheterization with  realization of minimal coronary disease, we had tobacco cessation talked  to the patient.  She told them she had no interest in quitting smoking.  Cardiac Rehab saw the patient ambulated, also discussed alcohol and  smoking and again she stated she did  not plan to quit drinking or  smoking.  The patient was ambulated.  We had Nutrition follow her again  for low-salt diet with her history of nonischemic cardiomyopathy.  She  was not happy with the whole idea of changing any of her habits.  By  June 13, 2009, she was ambulating.  She had no complaints, and she was  ready for discharge.   LABORATORY VALUES:  Hemoglobin at discharge 15.8, hematocrit 45.6, MCV  was 106.  She may need B12 evaluation as an outpatient, WBC 8.9,  platelets 185, neutrophils 64, lymphs 28, monos 7, eos 0, baso 1.  Protime 14.8, INR of 1.1, PTT 26.   Initially, sodium was 126, potassium 3.2, these were replaced.  At  discharge, sodium 130, potassium 4.6, chloride 97, CO2 of 24, glucose  101, BUN 8, creatinine 0.6.   Total protein at discharge 6.7, albumin 3.2, AST 40, ALT 22, alkaline  phos 70, total bili 1.2, magnesium 2.1.  Please note AST on admission  had been 71, ALT 38, so this had improved.  Glycohemoglobin was 5.3.   Cardiac enzymes:  CK range 105, 103, and 89; MBs 5.3, 4.1, and 2.7.  Minimal elevation in relative index, but the CK actually is  nondiagnostic.  Troponin is 0.11, 0.17, and 0.13.  Again felt to be due  to heart failure and rapid heart rate.   Cholesterol 192, triglycerides 91, HDL 63, LDL 111.   TSH initially 1.80, free T4 and TSH 5.044.  RPR was negative or  nonreactive.  Chest x-ray:  Congestive heart failure with gradual  improvement during hospital stay and prior to discharge decreased  pulmonary edema with bibasilar atelectasis and small bilateral pleural  effusions.  EKG initially AFib rapid ventricular response.  Followup  EKGs sinus rhythm, rate controlled at 68, left axis deviation, cannot  rule out anterior septal infarct.  She had deep T-wave inversions in V1,  V2, V3, and V4.   The patient will follow up with Dr. Lynnea Ferrier hopefully as instructed  though she had not made any decisions to change her lifestyle or   behaviors.      Darcella Gasman. [place], N.P.      Ritta Slot, MD  Electronically Signed    LRI/MEDQ  D:  06/19/2009  T:  06/20/2009  Job:  [id number]  cc:   Ritta Slot, MD

## 2011-04-08 NOTE — Op Note (Signed)
[name], [name]              ACCOUNT NO.:  [id number]   MEDICAL RECORD NO.:  [id number]          PATIENT TYPE:  AMB   LOCATION:  DAY                           FACILITY:  [facility]   PHYSICIAN:  Lionel December, M.D.    DATE OF BIRTH:  06-11-1935   DATE OF PROCEDURE:  06/27/2006  DATE OF DISCHARGE:                                 OPERATIVE REPORT   PROCEDURE:  Colonoscopy.   INDICATIONS:  [name] is a 77 year old Caucasian female who was recently  treated for diverticulitis and has responded to antibiotic therapy.  She is  undergoing colonoscopy both for diagnostic and screening purposes.  The  procedure risks were reviewed the patient, informed consent was obtained.   MEDICATIONS FOR CONSCIOUS SEDATION:  Demerol 50 mg IV, Versed 6 mg IV.   FINDINGS:  Procedure performed in endoscopy suite.  The patient's vital  signs and O2 saturation were monitored during procedure and remained stable.  The patient was placed in the left lateral recumbent position and rectal  examination performed.  No abnormality noted on external or digital exam.  The Olympus video scope was placed in the rectum and advanced under vision  into sigmoid colon and beyond.  Preparation was excellent.  She had a few  tiny diverticula scattered in the sigmoid colon.  The scope was passed to  cecum, which was identified by appendiceal orifice and ileocecal valve.  There was a polyp on a fold across from the ileocecal valve, which was  ablated via cold biopsy.  There were two more polyps in proximal transverse  colon, which were ablated via cold biopsy and submitted in one container.  Mucosa of the rest of the colon was normal.  Rectal mucosa similarly was  normal.  The scope was retroflexed to examine anorectal junction and small  hemorrhoids were noted below the dentate line.  Endoscope was straightened  and withdrawn.  The patient tolerated the procedure well.   FINAL DIAGNOSES:  1.  Three small polyps ablated via  cold biopsy, one from cecum and two from      proximal transverse colon.  2.  Few tiny diverticula at sigmoid colon.  3.  External hemorrhoids.   RECOMMENDATIONS:  1.  High-fiber diet plus fiber supplement.  2.  I will be contacting the patient with results of biopsy and further      recommendations.      Lionel December, M.D.  Electronically Signed     NR/MEDQ  D:  06/27/2006  T:  06/27/2006  Job:  [id number]   cc:   Patrica Duel, M.D.  Fax: [phone]

## 2011-04-08 NOTE — Discharge Summary (Signed)
[name], [name]                        ACCOUNT NO.:  [id number]   MEDICAL RECORD NO.:  [id number]                   PATIENT TYPE:  OBV   LOCATION:  3736                                 FACILITY:  [facility]   PHYSICIAN:  Darlin Priestly, M.D.             DATE OF BIRTH:  January 31, 1935   DATE OF ADMISSION:  07/21/2002  DATE OF DISCHARGE:  07/21/2002                                 DISCHARGE SUMMARY   DISCHARGE DIAGNOSES:  1. Chest pain.     a. Negative myocardial infarction.  2. Gastroesophageal reflux disease.  3. Abnormal EKG.   DISCHARGE CONDITION:  Improved.   PROCEDURES:  None.   DISCHARGE MEDICATION:  Aspirin 81 mg daily.   DISCHARGE INSTRUCTIONS:  1. Low-salt, low-fat diet.  2. Call [facility] and schedule for Cardiolite stress     test.   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  Friday prior to admission she pushed her  lawnmower to mow the grass, felt nauseated and got better, no chest pain at  that time.  On Saturday morning she had a little chest ache across her  shoulders and some in her left arm, it would come and go but no further  nausea, no shortness of breath, felt hot but no diaphoresis.   The patient had no further discomfort until she was at work and just did not  feel well and was seen in the [facility] Emergency Room.  Since that time  she has had no further chest pain.   PAST MEDICAL HISTORY:  Cardiac is negative.  She has a history of an ulcer  and Dr. Darnelle Catalan saw her 3 years ago, diagnosed her with gastroesophageal reflux  disease.  She has a heel spur.  She has no hypertension and no diabetes.   PAST SURGICAL HISTORY:  Hysterectomy in the 1930s for a suspicious D&C and  it was positive for cancer and no oophorectomy was done.   ALLERGIES:  PENICILLIN caused a mild rash.   OUTPATIENT  MEDICATIONS:  None except over-the-counter Pepcid.   FAMILY HISTORY:  Mother died at 98 of old age.  Father died of CVA at 66.  There are three brothers alive and well, no cardiac history.  Two sisters,  one twin with thyroid problems, the other one alive and well.   SOCIAL HISTORY:  Divorced; no children.  She does have a dog on heart meds.  One-pack-per-day smoker since age 39; no alcohol.  Active lifestyle.   REVIEW OF SYSTEMS:  See H&P.   PHYSICAL EXAMINATION AT DISCHARGE:  VITAL SIGNS: Blood pressure 130/70,  pulse 74, respirations 20, room air oxygen saturation 94%.  GENERAL: Alert and oriented black female in no acute distress.  SKIN: Warm and dry.  LUNGS: Clear,  without rales, rhonchi, or wheezes.  CARDIAC: Heart sounds S1, S2, regular rate and rhythm; no obvious murmur  noted.  NECK: Supple; no JVD, no bruits.  ABDOMEN: Soft, nontender, positive bowel sounds.  EXTREMITIES: Without edema.   LABORATORY DATA:  Hemoglobin 13.1, hematocrit 38.9, platelets 225, WBC 3.9.  PT 14.3, INR of 1.1, PTT 91.  Sodium 138, potassium 3.5, chloride 102, CO2  28, glucose 111, BUN 5, creatinine 0.7, calcium 9.1, total protein 6.7,  albumin 3.7, AST 29, ALT 36, ALP 71, total bili 0.5.  Cardiac enzymes: CK 57-56, MB 1.1 and troponin 0.02; negative MI.   TSH 1.652.   EKG: Sinus rhythm with occasional PAC, nonspecific T wave abnormality; no  old tracings to compare.  Chest x-ray per ER MD no active disease.   HOSPITAL COURSE:  The patient was admitted to [facility]  transferred to [facility] and admitted with chest pain rule out cardiac.  She had  no further chest pain, was stable and was originally placed on IV heparin.  Followup cardiac enzymes were negative.  She wanted to be discharged home  once the labs came back negative.  It was arranged for her to have an  outpatient Cardiolite and that was on July 31, 2002.  Her lipid panel  was not done here in the hospital.  She will follow  up with Dr. Jenne Campus as  an outpatient.     [name]                     Darlin Priestly, M.D.    LRI/MEDQ  D:  08/09/2002  T:  08/13/2002  Job:  [id number]   cc:   Madelin Rear. Sherwood Gambler, M.D.  [address]  [place]  [place] [address]  Fax: [phone]

## 2011-04-08 NOTE — Procedures (Signed)
   [name], [name] NO.:  [id number]   MEDICAL RECORD NO.:  [id number]                   PATIENT TYPE:  PREC   LOCATION:                                       FACILITY:   PHYSICIAN:  Glynn Bing, M.D.               DATE OF BIRTH:   DATE OF PROCEDURE:  03/13/2003  DATE OF DISCHARGE:                                  ECHOCARDIOGRAM   REFERRING PHYSICIANS:  Madelin Rear. Sherwood Gambler, M.D. and Vida Roller, M.D.   CLINICAL INFORMATION:  A 76 year old woman with chest pain consistent with  angina.   M-MODE:  AORTA:  3.0  (<4.0)  LEFT ATRIUM:  3.6  (<4.0)  SEPTUM:  1.1  (0.7-1.1)  POSTERIOR WALL:  1.1  (0.7-1.1)  LV-DIASTOLE:  4.1  (<5.7)  LV-SYSTOLE:  2.5  (<4.0)   FINDINGS/IMPRESSION:  1. A technically adequate echocardiographic study.  2. Left atrial size at the upper limits of normal to slightly increased.  3. Normal right ventricular size with mild-to-moderate hypertrophy and     normal systolic function.  4. Mild aortic valvular sclerosis with normal function.  5. Normal mitral valve with mild annular calcification and trivial     regurgitation.  6. Normal tricuspid valve with trivial regurgitation.  7. Normal internal dimension of the left ventricle; borderline LVH; normal     regional and global LV systolic function.  8. Normal IVC.                                               Hemlock Bing, M.D.    RR/MEDQ  D:  03/14/2003  T:  03/15/2003  Job:  [id number]

## 2013-06-19 ENCOUNTER — Encounter (HOSPITAL_COMMUNITY): Payer: Self-pay | Admitting: Emergency Medicine

## 2013-06-19 ENCOUNTER — Emergency Department (HOSPITAL_COMMUNITY): Payer: Medicare Other

## 2013-06-19 ENCOUNTER — Emergency Department (HOSPITAL_COMMUNITY)
Admission: EM | Admit: 2013-06-19 | Discharge: 2013-06-19 | Disposition: A | Discharge status: Home / Self Care | Payer: Medicare Other | Attending: Emergency Medicine | Admitting: Emergency Medicine

## 2013-06-19 DIAGNOSIS — Z96649 Presence of unspecified artificial hip joint: Secondary | ICD-10-CM | POA: Insufficient documentation

## 2013-06-19 DIAGNOSIS — Z880 Allergy status to penicillin: Secondary | ICD-10-CM | POA: Insufficient documentation

## 2013-06-19 DIAGNOSIS — I209 Angina pectoris, unspecified: Secondary | ICD-10-CM | POA: Insufficient documentation

## 2013-06-19 DIAGNOSIS — F17200 Nicotine dependence, unspecified, uncomplicated: Secondary | ICD-10-CM | POA: Insufficient documentation

## 2013-06-19 LAB — CBC WITH DIFFERENTIAL/PLATELET
Basophils Absolute: 0 10*3/uL (ref 0.0–0.1)
Basophils Relative: 0 % (ref 0–1)
MCHC: 34.4 g/dL (ref 30.0–36.0)
Neutro Abs: 4.9 10*3/uL (ref 1.7–7.7)
Neutrophils Relative %: 61 % (ref 43–77)
Platelets: 274 10*3/uL (ref 150–400)
RDW: 13.3 % (ref 11.5–15.5)

## 2013-06-19 LAB — BASIC METABOLIC PANEL
Chloride: 94 mEq/L — ABNORMAL LOW (ref 96–112)
GFR calc Af Amer: >90 mL/min (ref >90)
Potassium: 3.6 mEq/L (ref 3.5–5.1)

## 2013-06-19 LAB — TROPONIN I: Troponin I: <0.3 ng/mL (ref <0.30)

## 2013-06-19 MED ORDER — ASPIRIN EC 325 MG PO TBEC: 325.0000 mg | DELAYED_RELEASE_TABLET | Freq: Every day | ORAL | Status: DC

## 2013-06-19 NOTE — ED Notes (Signed)
Pt does not wish to stay for 2nd trop. Dr. Rhunette Croft aware. MD in and talked with pt. Pt does not wish to wait for papers. Pt left before signing dc out.

## 2013-06-19 NOTE — ED Provider Notes (Signed)
CSN: [id number]     Arrival date & time 06/19/13  1141 History    This chart was scribed for Derwood Kaplan, MD by Leone Payor, ED Scribe. This patient was seen in room APA01/APA01 and the patient's care was started 12:40 PM.   First MD Initiated Contact with Patient 06/19/13 1211     Chief Complaint  Patient presents with  . Chest Pain    The history is provided by the patient. No language interpreter was used.    HPI Comments: [name] is a 78 y.o. female who presents to the Emergency Department complaining of intermittent chest pain that has been ongoing for the past 1 month. States the episodes last for a few seconds at a time. She describes the pain as a light flutter. Pt denies nausea, vomiting, diaphoresis. Denies similar symptoms in the past. Denies h/o MI. She states that deep breathing usually alleviates the pain.   Pt started smoking in her 30's. She is currently a 1.5 pack per day smoker.   History reviewed. No pertinent past medical history. Past Surgical History  Procedure Laterality Date  . Hip placement     History reviewed. No pertinent family history. History  Substance Use Topics  . Smoking status: Current Every Day Smoker  . Smokeless tobacco: Not on file  . Alcohol Use: No   OB History   Grav Para Term Preterm Abortions TAB SAB Ect Mult Living                 Review of Systems  Constitutional: Negative for diaphoresis.  Cardiovascular: Positive for chest pain.  Gastrointestinal: Negative for nausea and vomiting.  All other systems reviewed and are negative.    Allergies  Penicillins  Home Medications  No current outpatient prescriptions on file. BP 179/57  Pulse 76  Temp(Src) 97.7 F (36.5 C) (Oral)  Resp 16  Ht 5' 4.5" (1.638 m)  Wt 140 lb (63.504 kg)  BMI 23.67 kg/m2  SpO2 100% Physical Exam  Nursing note and vitals reviewed. Constitutional: She is oriented to person, place, and time. She appears well-developed and  well-nourished.  HENT:  Head: Normocephalic and atraumatic.  Eyes: Conjunctivae and EOM are normal. Pupils are equal, round, and reactive to light.  Neck: Normal range of motion. Neck supple. No JVD present.  Cardiovascular: Normal rate, regular rhythm and normal heart sounds.   Pulmonary/Chest: Effort normal and breath sounds normal. No respiratory distress. She has no wheezes. She has no rales. She exhibits no tenderness.  No crackles in the bases.   Abdominal: Soft. Bowel sounds are normal.  Musculoskeletal: Normal range of motion.  Neurological: She is alert and oriented to person, place, and time.  Skin: Skin is warm and dry.  Psychiatric: She has a normal mood and affect.    ED Course   Procedures (including critical care time)  DIAGNOSTIC STUDIES: Oxygen Saturation is 100% on RA, normal by my interpretation.    COORDINATION OF CARE: 12:49 PM Discussed treatment plan with pt at bedside and pt agreed to plan.   Labs Reviewed  BASIC METABOLIC PANEL - Abnormal; Notable for the following:    Sodium 131 (*)    Chloride 94 (*)    Glucose, Bld 117 (*)    GFR calc non Af Amer 87 (*)    All other components within normal limits  CBC WITH DIFFERENTIAL  TROPONIN I   Dg Chest 2 View  06/19/2013   *RADIOLOGY REPORT*  Clinical Data: Chest  pain.  Smoking history.  CHEST - 2 VIEW  Comparison: 08/01/2010  Findings: Artifact overlies chest.  Heart size is normal.  There is calcification of the aorta.  Lungs are clear.  No effusions.  No significant bony finding.  IMPRESSION: No active cardiopulmonary disease.   Original Report Authenticated By: Paulina Fusi, M.D.   No diagnosis found.  MDM  I personally performed the services described in this documentation, which was scribed in my presence. The recorded information has been reviewed and is accurate.  PT comes in with intermittent left sided chest pain, and some nausea starting today. Has cardiac risk factors - Age, Women, HL, 50+ pack  year smoking hx. We wanted to admit her for ACS r/o, however, patient refused the admission, she even refused troponin x 2. I spoke with Dr. Sharyon [facility] clinic. They will schedule an appointment with her this week, they also advised that patient use the walk in clinic at 7 am - which i have communicated with the patient.    Patient wants to leave against medical advice. Patient understands that his/her actions will lead to inadequate medical workup, and that he/she is at risk of complications of missed diagnosis, which includes morbidity and mortality.  Patient is demonstrating good capacity to make decision. Patient understands that he/she needs to return to the ER immediately if his/her symptoms get worse.       Date: 06/19/2013  Rate: 70  Rhythm: normal sinus rhythm  QRS Axis: normal  Intervals: normal  ST/T Wave abnormalities: nonspecific ST/T changes  Conduction Disutrbances:none  Narrative Interpretation:   Old EKG Reviewed: unchanged    Derwood Kaplan, MD 06/19/13 1517

## 2013-06-19 NOTE — ED Notes (Signed)
Chest pain to left side intermittent x 30 days. Had  N/v/d yesterday but none today and has eaten. Alert/oreinted. nad at this time.nondiaphoretic. Dizziness intermittent x 1 month also but not always with the cp. Pt states has to hold on to things for a minute to get balance.

## 2014-10-09 ENCOUNTER — Other Ambulatory Visit (HOSPITAL_COMMUNITY): Payer: Self-pay | Admitting: Family Medicine

## 2014-10-09 DIAGNOSIS — Z1231 Encounter for screening mammogram for malignant neoplasm of breast: Secondary | ICD-10-CM

## 2014-10-10 ENCOUNTER — Other Ambulatory Visit (HOSPITAL_COMMUNITY): Payer: Self-pay | Admitting: Family Medicine

## 2014-10-10 DIAGNOSIS — Z13820 Encounter for screening for osteoporosis: Secondary | ICD-10-CM

## 2014-10-20 ENCOUNTER — Ambulatory Visit (HOSPITAL_COMMUNITY): Payer: Medicare Other

## 2014-10-20 ENCOUNTER — Other Ambulatory Visit (HOSPITAL_COMMUNITY): Payer: Medicare Other

## 2014-10-28 ENCOUNTER — Telehealth: Payer: Self-pay

## 2014-10-28 NOTE — Telephone Encounter (Signed)
Pt received letter from DS to set up colonoscopy. Please call [phone]

## 2014-10-28 NOTE — Telephone Encounter (Signed)
Pt called again saying that she has been waiting for 2 hours for someone to call her back and she can't sit around waiting for a phone call and she has things to do. I told her that DS has the message to call her back and she would be in touch. I told patient to hold and I would see if DS could break away to speak with her, but the patient hung up.

## 2014-10-29 NOTE — Telephone Encounter (Signed)
Noted and agree. 

## 2014-10-29 NOTE — Telephone Encounter (Signed)
Pt has called back again asking for DS to be triaged because she received a letter. I told her DS was with another patient at the moment. Please call her back at [phone]

## 2014-10-29 NOTE — Telephone Encounter (Signed)
I spoke to pt. She said she is having a lot of trouble with constipation. She is using laxatives and benefiber. I told her we cannot advise since we have not seen her in years. She is scheduled OV with Neil Crouch, PA on 12/02/2014 at 9:00 AM. I told her to [place] she contacts PCP about the constipation for advise now. She said she would.

## 2014-11-03 ENCOUNTER — Ambulatory Visit (HOSPITAL_COMMUNITY): Admission: RE | Admit: 2014-11-03 | Payer: Medicare Other | Source: Ambulatory Visit

## 2014-11-03 ENCOUNTER — Ambulatory Visit (HOSPITAL_COMMUNITY)
Admission: RE | Admit: 2014-11-03 | Discharge: 2014-11-03 | Disposition: A | Discharge status: Home / Self Care | Payer: Medicare HMO | Source: Ambulatory Visit | Attending: Family Medicine | Admitting: Family Medicine

## 2014-11-03 DIAGNOSIS — Z90722 Acquired absence of ovaries, bilateral: Secondary | ICD-10-CM | POA: Diagnosis not present

## 2014-11-03 DIAGNOSIS — Z13820 Encounter for screening for osteoporosis: Secondary | ICD-10-CM | POA: Diagnosis not present

## 2014-11-03 DIAGNOSIS — Z780 Asymptomatic menopausal state: Secondary | ICD-10-CM | POA: Insufficient documentation

## 2014-11-03 DIAGNOSIS — F17210 Nicotine dependence, cigarettes, uncomplicated: Secondary | ICD-10-CM | POA: Diagnosis not present

## 2014-12-02 ENCOUNTER — Ambulatory Visit: Payer: Medicare Other | Admitting: Gastroenterology

## 2014-12-04 ENCOUNTER — Ambulatory Visit (HOSPITAL_COMMUNITY)
Admission: RE | Admit: 2014-12-04 | Discharge: 2014-12-04 | Disposition: A | Discharge status: Home / Self Care | Payer: Medicare HMO | Source: Ambulatory Visit | Attending: Family Medicine | Admitting: Family Medicine

## 2014-12-04 ENCOUNTER — Other Ambulatory Visit (HOSPITAL_COMMUNITY): Payer: Self-pay | Admitting: Family Medicine

## 2014-12-04 DIAGNOSIS — M545 Low back pain: Secondary | ICD-10-CM | POA: Insufficient documentation

## 2015-08-04 ENCOUNTER — Other Ambulatory Visit (HOSPITAL_COMMUNITY): Payer: Self-pay | Admitting: Family Medicine

## 2015-08-04 DIAGNOSIS — Z1231 Encounter for screening mammogram for malignant neoplasm of breast: Secondary | ICD-10-CM

## 2015-08-17 ENCOUNTER — Ambulatory Visit (HOSPITAL_COMMUNITY)
Admission: RE | Admit: 2015-08-17 | Discharge: 2015-08-17 | Disposition: A | Discharge status: Home / Self Care | Payer: Medicare HMO | Source: Ambulatory Visit | Attending: Family Medicine | Admitting: Family Medicine

## 2015-08-17 DIAGNOSIS — Z1231 Encounter for screening mammogram for malignant neoplasm of breast: Secondary | ICD-10-CM | POA: Insufficient documentation

## 2015-08-20 ENCOUNTER — Other Ambulatory Visit (HOSPITAL_COMMUNITY): Payer: Self-pay | Admitting: Family Medicine

## 2015-08-20 DIAGNOSIS — N6320 Unspecified lump in the left breast, unspecified quadrant: Secondary | ICD-10-CM

## 2015-09-01 ENCOUNTER — Ambulatory Visit (HOSPITAL_COMMUNITY)
Admission: RE | Admit: 2015-09-01 | Discharge: 2015-09-01 | Disposition: A | Discharge status: Home / Self Care | Payer: Medicare HMO | Source: Ambulatory Visit | Attending: Family Medicine | Admitting: Family Medicine

## 2015-09-01 DIAGNOSIS — R928 Other abnormal and inconclusive findings on diagnostic imaging of breast: Secondary | ICD-10-CM | POA: Diagnosis not present

## 2015-09-01 DIAGNOSIS — N6320 Unspecified lump in the left breast, unspecified quadrant: Secondary | ICD-10-CM

## 2015-09-07 ENCOUNTER — Emergency Department (HOSPITAL_COMMUNITY)
Admission: EM | Admit: 2015-09-07 | Discharge: 2015-09-07 | Disposition: A | Discharge status: Home / Self Care | Payer: Medicare HMO | Attending: Emergency Medicine | Admitting: Emergency Medicine

## 2015-09-07 ENCOUNTER — Encounter (HOSPITAL_COMMUNITY): Payer: Self-pay | Admitting: Emergency Medicine

## 2015-09-07 ENCOUNTER — Emergency Department (HOSPITAL_COMMUNITY): Payer: Medicare HMO

## 2015-09-07 DIAGNOSIS — Y9389 Activity, other specified: Secondary | ICD-10-CM | POA: Diagnosis not present

## 2015-09-07 DIAGNOSIS — S79911A Unspecified injury of right hip, initial encounter: Secondary | ICD-10-CM | POA: Diagnosis present

## 2015-09-07 DIAGNOSIS — M1990 Unspecified osteoarthritis, unspecified site: Secondary | ICD-10-CM | POA: Diagnosis not present

## 2015-09-07 DIAGNOSIS — Z880 Allergy status to penicillin: Secondary | ICD-10-CM | POA: Insufficient documentation

## 2015-09-07 DIAGNOSIS — Y9289 Other specified places as the place of occurrence of the external cause: Secondary | ICD-10-CM | POA: Insufficient documentation

## 2015-09-07 DIAGNOSIS — Y998 Other external cause status: Secondary | ICD-10-CM | POA: Diagnosis not present

## 2015-09-07 DIAGNOSIS — Z720 Tobacco use: Secondary | ICD-10-CM | POA: Diagnosis not present

## 2015-09-07 DIAGNOSIS — W010XXA Fall on same level from slipping, tripping and stumbling without subsequent striking against object, initial encounter: Secondary | ICD-10-CM | POA: Diagnosis not present

## 2015-09-07 DIAGNOSIS — M25551 Pain in right hip: Secondary | ICD-10-CM

## 2015-09-07 DIAGNOSIS — W19XXXA Unspecified fall, initial encounter: Secondary | ICD-10-CM

## 2015-09-07 HISTORY — DX: Unspecified osteoarthritis, unspecified site: M19.90

## 2015-09-07 MED ORDER — HYDROGEN PEROXIDE 3 % EX SOLN
CUTANEOUS | Status: AC
  Filled 2015-09-07: qty 473

## 2015-09-07 MED ORDER — ACETAMINOPHEN 325 MG PO TABS
650.0000 mg | ORAL_TABLET | Freq: Once | ORAL | Status: AC
  Administered 2015-09-07: 650 mg via ORAL
  Filled 2015-09-07: qty 2

## 2015-09-07 NOTE — Discharge Instructions (Signed)
Hip Pain Your hip is the joint between your upper legs and your lower pelvis. The bones, cartilage, tendons, and muscles of your hip joint perform a lot of work each day supporting your body weight and allowing you to move around. Hip pain can range from a minor ache to severe pain in one or both of your hips. Pain may be felt on the inside of the hip joint near the groin, or the outside near the buttocks and upper thigh. You may have swelling or stiffness as well.  HOME CARE INSTRUCTIONS   Take medicines only as directed by your health care provider.  Apply ice to the injured area:  Put ice in a plastic bag.  Place a towel between your skin and the bag.  Leave the ice on for 15-20 minutes at a time, 3-4 times a day.  Keep your leg raised (elevated) when possible to lessen swelling.  Avoid activities that cause pain.  Follow specific exercises as directed by your health care provider.  Sleep with a pillow between your legs on your most comfortable side.  Record how often you have hip pain, the location of the pain, and what it feels like. SEEK MEDICAL CARE IF:   You are unable to put weight on your leg.  Your hip is red or swollen or very tender to touch.  Your pain or swelling continues or worsens after 1 week.  You have increasing difficulty walking.  You have a fever. SEEK IMMEDIATE MEDICAL CARE IF:   You have fallen.  You have a sudden increase in pain and swelling in your hip. MAKE SURE YOU:   Understand these instructions.  Will watch your condition.  Will get help right away if you are not doing well or get worse.   This information is not intended to replace advice given to you by your health care provider. Make sure you discuss any questions you have with your health care provider.     Your xrays are negative for any sign of injury such as fractures, dislocation or injury to your hip replacement hardware.  Please follow up with your doctor if your symptoms  persist.  I recommend using tylenol arthritis strength 500 mg tablets for pain relief as needed.  A heating pad may also help with pain - try 20 minutes of heat 3-4 times daily.

## 2015-09-07 NOTE — ED Notes (Signed)
Assisted pt to BR, went in to check on pt and pt had been smoking in BR, CN was making rounds and is aware of pt wanting to go home and smoking in BR, CN made Evalee Jefferson, [place] aware of above as well

## 2015-09-07 NOTE — ED Provider Notes (Signed)
CSN: [id number]     Arrival date & time 09/07/15  7026 History   First MD Initiated Contact with Patient 09/07/15 1004     Chief Complaint  Patient presents with  . Fall  . Hip Pain     (Consider location/radiation/quality/duration/timing/severity/associated sxs/prior Treatment) The history is provided by the patient.   [name] is a 80 y.o. female  Presenting with persistent right hip and posterior buttock pain since tripping a falling when she walked to her mailbox one week ago, landing on pavement.  She endorses increasing falls over the past few months,  Is supposed to be using a walker but frequently does not use, stating its inconvenient.  She does have a history of right hip replacement.  She is ambulatory, weight bearing increases pain.  She denies pain at rest. She has taken no medicines for her symptoms.  She has no significant past medical history.  She tried to see her pcp, reports no appts available.    Past Medical History  Diagnosis Date  . Arthritis    Past Surgical History  Procedure Laterality Date  . Hip placement    . Total hip arthroplasty     No family history on file. Social History  Substance Use Topics  . Smoking status: Current Every Day Smoker    Types: Cigarettes  . Smokeless tobacco: None  . Alcohol Use: No   OB History    No data available     Review of Systems  Constitutional: Negative for fever.  Musculoskeletal: Positive for arthralgias. Negative for myalgias, back pain and joint swelling.  Neurological: Negative for weakness and numbness.      Allergies  Penicillins  Home Medications   Prior to Admission medications   Medication Sig Start Date End Date Taking? Authorizing Provider  aspirin EC 325 MG tablet Take 1 tablet (325 mg total) by mouth daily. 06/19/13   Ankit Kathrynn Humble, MD   BP 121/107 mmHg  Pulse 72  Temp(Src) 97.6 F (36.4 C) (Oral)  Resp 18  Ht 5\' 4"  (1.626 m)  Wt 136 lb (61.689 kg)  BMI 23.33 kg/m2   SpO2 92% Physical Exam  Constitutional: She appears well-developed and well-nourished.  HENT:  Head: Atraumatic.  Neck: Normal range of motion.  Cardiovascular:  Pulses:      Dorsalis pedis pulses are 2+ on the right side, and 2+ on the left side.  Pulses equal bilaterally  Musculoskeletal: She exhibits tenderness.       Right hip: She exhibits bony tenderness. She exhibits no swelling and no deformity.  ttp at right hip greater trochanter and along right posterior iliac crest.  No lumbar pain midline pain. No hip or groin pain with right hip flex/ext or internal and external rotation.  Neurological: She is alert. She has normal strength. She displays normal reflexes. No sensory deficit. Coordination and gait normal.  Skin: Skin is warm and dry.  Psychiatric: She has a normal mood and affect.    ED Course  Procedures (including critical care time) Labs Review Labs Reviewed - No data to display  Imaging Review Dg Hips Bilat With Pelvis Min 5 Views  09/07/2015  CLINICAL DATA:  Fall going to mailbox 2 days ago. Right hip pain. Tenderness also along the left iliac crest. EXAM: DG HIP (WITH OR WITHOUT PELVIS) 5+V BILAT COMPARISON:  None. FINDINGS: Changes of right hip replacement. No hardware complicating feature. No acute bony abnormality. No fracture, subluxation or dislocation. IMPRESSION: No acute bony abnormality.  Electronically Signed   By: Rolm Baptise M.D.   On: 09/07/2015 12:07   I have personally reviewed and evaluated these images and lab results as part of my medical decision-making.   EKG Interpretation None      MDM   Final diagnoses:  Fall    Pt was given tylenol here with reported improved pain.  She was ambulatory in dept.      Radiological studies were viewed, interpreted and considered during the medical decision making and disposition process. I agree with radiologists reading.  Results were also discussed with patient. Advised f/u with her pcp and/or her  orthopedic surgeon if sx persist.  Advised heat tx, tylenol prn pain.  Advised she uses her walker to avoid further falls.     Evalee Jefferson, PA-C 09/10/15 1050  Harvel Quale, MD 09/10/15 [phone]

## 2015-09-07 NOTE — ED Notes (Signed)
Pt in bathroom smoking. Pt made aware she could not smoke in the hospital. Escorted back to her room. Pt state she has to go she has a friend waiting on her. EDPA aware

## 2015-09-07 NOTE — ED Notes (Signed)
Pt c/o of RT sided hip pain. Pt had RT hip replacement 3-4 years ago. Pt states she fell walking to the mailbox a week ago. Pt states she has had an increase in falls over the last few months. Pt is supposed to ambulate with a walker. No rotation or dislocation noted. Pt ambulatory with assistance.

## 2015-09-07 NOTE — ED Notes (Signed)
Pt c/o ear wax build up to left ear,  Ear wax noted and will irrigate ear

## 2015-09-15 ENCOUNTER — Telehealth: Payer: Self-pay | Admitting: Orthopedic Surgery

## 2015-09-15 NOTE — Telephone Encounter (Signed)
Patient called today, 09/15/15, status/post [facility] room visit on 09/07/15; states "has a broken hip."  Relays she had a total hip replacement about 5 years ago by orthopedic surgeon in [place], and no longer has transportation to [place].  States lives alone.  States was advised by [facility] emergency room physician to be admitted, however, she declined.  Please review and advise if to schedule, and if so, if previous orthopedic records needed for review prior.  Patient's home ph# is [phone].

## 2015-09-16 NOTE — Telephone Encounter (Signed)
There is no fracture   We can see her 2 weeks no need to see sooner

## 2015-09-16 NOTE — Telephone Encounter (Signed)
Called back to patient; scheduled appointment accordingly. °

## 2015-10-01 ENCOUNTER — Encounter: Payer: Self-pay | Admitting: Orthopedic Surgery

## 2015-10-01 ENCOUNTER — Telehealth: Payer: Self-pay | Admitting: *Deleted

## 2015-10-01 ENCOUNTER — Ambulatory Visit (INDEPENDENT_AMBULATORY_CARE_PROVIDER_SITE_OTHER): Payer: Medicare HMO | Admitting: Orthopedic Surgery

## 2015-10-01 VITALS — BP 152/86 | Ht 64.0 in | Wt 136.0 lb

## 2015-10-01 DIAGNOSIS — M4806 Spinal stenosis, lumbar region: Secondary | ICD-10-CM

## 2015-10-01 DIAGNOSIS — M48061 Spinal stenosis, lumbar region without neurogenic claudication: Secondary | ICD-10-CM

## 2015-10-01 NOTE — Telephone Encounter (Signed)
Returned call, busy signal only ?

## 2015-10-01 NOTE — Progress Notes (Signed)
Patient ID: [name], female   DOB: 10/20/1935, 80 y.o.   MRN: [id number]  Chief Complaint  Patient presents with  . Follow-up    ER follow up on right hip, DOI 09-07-15.    HPI [name] is a 80 y.o. female.  The patient presents with really a complaint of falling. Her history is as follows She had a right femoral neck fracture treated with a cemented bipolar prosthesis 5-6 years ago by Dr. Mardelle Matte. She then injured her tailbone when she fell trying to get the mail from the [employer]. She says that she falls all the time she has frequent falls she can't walk like she used to she says she has to take real baby steps and she always feels as if she is going to fall over. She has lower back pain radiating to the right thigh proximally without distal symptoms  System review she does not complain of fever chills or night sweats. She does not have bowel or bladder dysfunction. She does have altered gait as described and she requires assistive device cane or walker.  Review of Systems Review of Systems  Past Medical History  Diagnosis Date  . Arthritis     Past Surgical History  Procedure Laterality Date  . Hip placement    . Total hip arthroplasty      No family history on file.  Social History Social History  Substance Use Topics  . Smoking status: Current Every Day Smoker    Types: Cigarettes  . Smokeless tobacco: Not on file  . Alcohol Use: No    Allergies  Allergen Reactions  . Penicillins     Current Outpatient Prescriptions  Medication Sig Dispense Refill  . aspirin EC 325 MG tablet Take 1 tablet (325 mg total) by mouth daily. 30 tablet 0   No current facility-administered medications for this visit.       Physical Exam Physical Exam Blood pressure 152/86, height 5\' 4"  (1.626 m), weight 136 lb (61.689 kg). Appearance, there are no abnormalities in terms of appearance the patient was well-developed and well-nourished. The grooming and hygiene were  normal.  Mental status orientation, there was normal alertness and orientation Mood pleasant Ambulatory status normal with no assistive devices  Examination of the right and left hip exhibit normal hip flexion without pain motor exam normal hip flexion knee extension both hips stable reflexes 2+ and equal vascular exam normal  Lower back pain lower back tenderness straight leg raise is negative  Spinal deformity with kyphosis.   Data Reviewed When I look at her x-ray she has a well placed cemented bipolar hip replacement with some calcification of the femoral artery and some grade 1 heterotopic bone. Some chronic changes of the lesser trochanter consistent with her fracture. She had a pelvis AP and lateral of the right hip and an AP and lateral left hip. I've interpreted the left hip AP lateral is normal hip x-ray  We go back into her history and we see x-rays of lumbar spine I interpret those as spondylosis with vascular calcifications.   X-rays of the lumbar spine January 2016 FINDINGS HISTORY: LOW BACK PAIN FOLLOWING A LIFTING INJURY THIS MORNING. LUMBAR SPINE COMPLETE: THERE ARE FIVE NONRIB BEARING LUMBAR VERTEBRAE AND MILD [place].  MILD TO MODERATE LEFT LATERAL SPUR FORMATION IS DEMONSTRATED AT THE L2-3 LEVEL AND MILD ANTERIOR SPUR FORMATION IS NOTED AT THE L3-4 LEVEL.  THERE IS MINIMAL ANTERIOR SPUR FORMATION AT THE L4-5 LEVEL. NO  FRACTURES OR SUBLUXATIONS ARE SEEN.  NO PARS DEFECTS ARE DEMONSTRATED.  ATHEROMATOUS ARTERIAL CALCIFICATIONS ARE NOTED WITHOUT EVIDENCE OF ANEURYSM.  ALSO NOTED ARE MILD FACET DEGENERATIVE CHANGES AT THE L5-S1 LEVEL. IMPRESSION MILD SCOLIOSIS AND MILD DEGENERATIVE CHANGES AS DESCRIBED ABOVE.  Assessment  Stable hip prosthesis Spinal stenosis   Plan  MRIs for spinal stenosis to evaluate the patient for frequent falls  Come back for explanation of results and treatment management

## 2015-10-01 NOTE — Telephone Encounter (Signed)
Patient called during lunch left message on machine, patient stated she needs to speak with someone about what Dr. Aline Brochure is going to do, patient stated she paid 45$ for nothing this morning and she doesn't understand anything. Please advise [phone].

## 2015-10-01 NOTE — Patient Instructions (Signed)
Note to move mailbox to her side of street  We will schedule MRI  And call you with appointment

## 2015-10-05 NOTE — Telephone Encounter (Signed)
PATIENT AWARE WE ARE WORKING ON MRI

## 2015-10-05 NOTE — Telephone Encounter (Signed)
Patient left message on machine returning call. Please advise

## 2015-10-05 NOTE — Telephone Encounter (Signed)
Returned call, no answer, left vm   Dr Aline Brochure ordered MRI, we will call when it is scheduled, he will not know how to proceed until we receive results

## 2015-10-19 ENCOUNTER — Telehealth: Payer: Self-pay | Admitting: Orthopedic Surgery

## 2015-10-19 NOTE — Telephone Encounter (Signed)
Routing to Dr Harrison 

## 2015-10-19 NOTE — Telephone Encounter (Signed)
Patient had been made aware 10/05/15, and also 10/13/15, that her MRI was pending insurance review (MRI pre-authorization request had been done via online portal).  Today, 10/19/15, call received from [facility] contact, [place] -- Per Randall Hiss, phone # [phone], option#1 / Reference [id number] - stating that MRI is pending denial; physician to physician review needed - please contact at this phone #.

## 2015-10-20 NOTE — Telephone Encounter (Signed)
Call them and when the physician gets on the phone I will talk to them

## 2015-10-22 DIAGNOSIS — I4891 Unspecified atrial fibrillation: Secondary | ICD-10-CM

## 2015-10-22 HISTORY — DX: Unspecified atrial fibrillation: I48.91

## 2015-11-02 NOTE — Telephone Encounter (Signed)
Attempted 10/26/15.

## 2015-11-06 ENCOUNTER — Encounter (HOSPITAL_COMMUNITY): Payer: Self-pay | Admitting: *Deleted

## 2015-11-06 ENCOUNTER — Inpatient Hospital Stay (HOSPITAL_COMMUNITY)
Admission: EM | Admit: 2015-11-06 | Discharge: 2015-11-13 | DRG: 308 | Disposition: A | Discharge status: Skilled Nursing Facility | Payer: Medicare HMO | Attending: Internal Medicine | Admitting: Internal Medicine

## 2015-11-06 ENCOUNTER — Emergency Department (HOSPITAL_COMMUNITY): Payer: Medicare HMO

## 2015-11-06 DIAGNOSIS — Z791 Long term (current) use of non-steroidal anti-inflammatories (NSAID): Secondary | ICD-10-CM | POA: Diagnosis not present

## 2015-11-06 DIAGNOSIS — I2510 Atherosclerotic heart disease of native coronary artery without angina pectoris: Secondary | ICD-10-CM | POA: Diagnosis present

## 2015-11-06 DIAGNOSIS — R52 Pain, unspecified: Secondary | ICD-10-CM

## 2015-11-06 DIAGNOSIS — R339 Retention of urine, unspecified: Secondary | ICD-10-CM | POA: Insufficient documentation

## 2015-11-06 DIAGNOSIS — I493 Ventricular premature depolarization: Secondary | ICD-10-CM | POA: Diagnosis present

## 2015-11-06 DIAGNOSIS — E860 Dehydration: Secondary | ICD-10-CM | POA: Diagnosis present

## 2015-11-06 DIAGNOSIS — R410 Disorientation, unspecified: Secondary | ICD-10-CM | POA: Diagnosis not present

## 2015-11-06 DIAGNOSIS — Z96641 Presence of right artificial hip joint: Secondary | ICD-10-CM | POA: Diagnosis present

## 2015-11-06 DIAGNOSIS — R109 Unspecified abdominal pain: Secondary | ICD-10-CM | POA: Diagnosis not present

## 2015-11-06 DIAGNOSIS — D72829 Elevated white blood cell count, unspecified: Secondary | ICD-10-CM

## 2015-11-06 DIAGNOSIS — N179 Acute kidney failure, unspecified: Secondary | ICD-10-CM

## 2015-11-06 DIAGNOSIS — I5022 Chronic systolic (congestive) heart failure: Secondary | ICD-10-CM | POA: Insufficient documentation

## 2015-11-06 DIAGNOSIS — I429 Cardiomyopathy, unspecified: Secondary | ICD-10-CM | POA: Diagnosis not present

## 2015-11-06 DIAGNOSIS — I4891 Unspecified atrial fibrillation: Secondary | ICD-10-CM | POA: Diagnosis not present

## 2015-11-06 DIAGNOSIS — F17210 Nicotine dependence, cigarettes, uncomplicated: Secondary | ICD-10-CM | POA: Diagnosis present

## 2015-11-06 DIAGNOSIS — Z7982 Long term (current) use of aspirin: Secondary | ICD-10-CM | POA: Diagnosis not present

## 2015-11-06 DIAGNOSIS — M1990 Unspecified osteoarthritis, unspecified site: Secondary | ICD-10-CM | POA: Diagnosis present

## 2015-11-06 DIAGNOSIS — Z880 Allergy status to penicillin: Secondary | ICD-10-CM

## 2015-11-06 DIAGNOSIS — R1084 Generalized abdominal pain: Secondary | ICD-10-CM | POA: Diagnosis not present

## 2015-11-06 DIAGNOSIS — N200 Calculus of kidney: Secondary | ICD-10-CM | POA: Diagnosis present

## 2015-11-06 DIAGNOSIS — N390 Urinary tract infection, site not specified: Secondary | ICD-10-CM

## 2015-11-06 DIAGNOSIS — G9340 Encephalopathy, unspecified: Secondary | ICD-10-CM

## 2015-11-06 DIAGNOSIS — R296 Repeated falls: Secondary | ICD-10-CM | POA: Diagnosis present

## 2015-11-06 DIAGNOSIS — I7389 Other specified peripheral vascular diseases: Secondary | ICD-10-CM | POA: Diagnosis present

## 2015-11-06 DIAGNOSIS — R55 Syncope and collapse: Secondary | ICD-10-CM | POA: Diagnosis present

## 2015-11-06 DIAGNOSIS — I248 Other forms of acute ischemic heart disease: Secondary | ICD-10-CM | POA: Diagnosis present

## 2015-11-06 DIAGNOSIS — I472 Ventricular tachycardia, unspecified: Secondary | ICD-10-CM

## 2015-11-06 DIAGNOSIS — B961 Klebsiella pneumoniae [K. pneumoniae] as the cause of diseases classified elsewhere: Secondary | ICD-10-CM | POA: Diagnosis present

## 2015-11-06 DIAGNOSIS — G9349 Other encephalopathy: Secondary | ICD-10-CM | POA: Diagnosis present

## 2015-11-06 DIAGNOSIS — E876 Hypokalemia: Secondary | ICD-10-CM | POA: Diagnosis not present

## 2015-11-06 DIAGNOSIS — I959 Hypotension, unspecified: Secondary | ICD-10-CM | POA: Diagnosis present

## 2015-11-06 DIAGNOSIS — R0600 Dyspnea, unspecified: Secondary | ICD-10-CM

## 2015-11-06 DIAGNOSIS — I110 Hypertensive heart disease with heart failure: Secondary | ICD-10-CM | POA: Diagnosis present

## 2015-11-06 DIAGNOSIS — R5381 Other malaise: Secondary | ICD-10-CM

## 2015-11-06 HISTORY — DX: Malignant (primary) neoplasm, unspecified: C80.1

## 2015-11-06 HISTORY — DX: Unspecified atrial fibrillation: I48.91

## 2015-11-06 HISTORY — DX: Atherosclerotic heart disease of native coronary artery without angina pectoris: I25.10

## 2015-11-06 HISTORY — DX: Other cardiomyopathies: I42.8

## 2015-11-06 LAB — URINALYSIS, ROUTINE W REFLEX MICROSCOPIC
BILIRUBIN URINE: NEGATIVE
GLUCOSE, UA: NEGATIVE mg/dL
KETONES UR: 15 mg/dL — AB
Nitrite: POSITIVE — AB
PH: 8.5 — AB (ref 5.0–8.0)
PROTEIN: 100 mg/dL — AB
Specific Gravity, Urine: 1.015 (ref 1.005–1.030)

## 2015-11-06 LAB — URINE MICROSCOPIC-ADD ON

## 2015-11-06 LAB — CBC
HCT: 44.9 % (ref 36.0–46.0)
Hemoglobin: 14.9 g/dL (ref 12.0–15.0)
MCH: 30 pg (ref 26.0–34.0)
MCHC: 33.2 g/dL (ref 30.0–36.0)
MCV: 90.5 fL (ref 78.0–100.0)
PLATELETS: 237 10*3/uL (ref 150–400)
RBC: 4.96 MIL/uL (ref 3.87–5.11)
RDW: 14.8 % (ref 11.5–15.5)
WBC: 12.9 10*3/uL — AB (ref 4.0–10.5)

## 2015-11-06 LAB — BASIC METABOLIC PANEL
Anion gap: 13 (ref 5–15)
BUN: 44 mg/dL — ABNORMAL HIGH (ref 6–20)
CALCIUM: 9.5 mg/dL (ref 8.9–10.3)
CO2: 22 mmol/L (ref 22–32)
CREATININE: 1.44 mg/dL — AB (ref 0.44–1.00)
Chloride: 101 mmol/L (ref 101–111)
GFR, EST AFRICAN AMERICAN: 39 mL/min — AB (ref >60)
GFR, EST NON AFRICAN AMERICAN: 34 mL/min — AB (ref >60)
Glucose, Bld: 104 mg/dL — ABNORMAL HIGH (ref 65–99)
Potassium: 4.5 mmol/L (ref 3.5–5.1)
SODIUM: 136 mmol/L (ref 135–145)

## 2015-11-06 LAB — TSH: TSH: 2.848 u[IU]/mL (ref 0.350–4.500)

## 2015-11-06 LAB — CBG MONITORING, ED: Glucose-Capillary: 88 mg/dL (ref 65–99)

## 2015-11-06 LAB — TROPONIN I: TROPONIN I: 0.05 ng/mL — AB (ref <0.031)

## 2015-11-06 MED ORDER — DILTIAZEM HCL 100 MG IV SOLR
5.0000 mg/h | INTRAVENOUS | Status: DC
  Administered 2015-11-07: 5 mg/h via INTRAVENOUS
  Filled 2015-11-06: qty 100

## 2015-11-06 MED ORDER — SODIUM CHLORIDE 0.9 % IV SOLN
INTRAVENOUS | Status: DC
Start: 2015-11-06 — End: 2015-11-07

## 2015-11-06 MED ORDER — SENNOSIDES-DOCUSATE SODIUM 8.6-50 MG PO TABS: 1.0000 | ORAL_TABLET | Freq: Every evening | ORAL | Status: DC | PRN

## 2015-11-06 MED ORDER — ENOXAPARIN SODIUM 30 MG/0.3ML ~~LOC~~ SOLN
30.0000 mg | SUBCUTANEOUS | Status: DC
  Administered 2015-11-06: 30 mg via SUBCUTANEOUS
  Filled 2015-11-06: qty 0.3

## 2015-11-06 MED ORDER — ONDANSETRON HCL 4 MG/2ML IJ SOLN: 4.0000 mg | Freq: Four times a day (QID) | INTRAMUSCULAR | Status: DC | PRN

## 2015-11-06 MED ORDER — DILTIAZEM HCL 100 MG IV SOLR
5.0000 mg/h | INTRAVENOUS | Status: DC
  Administered 2015-11-06: 5 mg/h via INTRAVENOUS
  Filled 2015-11-06: qty 100

## 2015-11-06 MED ORDER — ONDANSETRON HCL 4 MG PO TABS: 4.0000 mg | ORAL_TABLET | Freq: Four times a day (QID) | ORAL | Status: DC | PRN

## 2015-11-06 MED ORDER — SODIUM CHLORIDE 0.9 % IV SOLN
INTRAVENOUS | Status: DC
  Administered 2015-11-07 (×2): via INTRAVENOUS
  Administered 2015-11-08: 75 mL via INTRAVENOUS

## 2015-11-06 MED ORDER — DILTIAZEM LOAD VIA INFUSION
10.0000 mg | Freq: Once | INTRAVENOUS | Status: AC
  Administered 2015-11-06: 10 mg via INTRAVENOUS

## 2015-11-06 MED ORDER — CIPROFLOXACIN IN D5W 400 MG/200ML IV SOLN
400.0000 mg | Freq: Two times a day (BID) | INTRAVENOUS | Status: DC
  Administered 2015-11-06 – 2015-11-08 (×4): 400 mg via INTRAVENOUS
  Filled 2015-11-06 (×4): qty 200

## 2015-11-06 MED ORDER — ACETAMINOPHEN 650 MG RE SUPP: 650.0000 mg | Freq: Four times a day (QID) | RECTAL | Status: DC | PRN

## 2015-11-06 MED ORDER — SODIUM CHLORIDE 0.9 % IV SOLN: INTRAVENOUS | Status: AC

## 2015-11-06 MED ORDER — ACETAMINOPHEN 325 MG PO TABS: 650.0000 mg | ORAL_TABLET | Freq: Four times a day (QID) | ORAL | Status: DC | PRN

## 2015-11-06 NOTE — ED Notes (Signed)
Pt states she woke up this morning and had an episode of syncope, this was unwitnessed. Pt states she fell besides of her bed, pt states she hit her head. Denies any pain at this time. Pt is alert and oriented.   Per EMS, pt has no daily meds but was prescribed cipro by PCP yesterday.

## 2015-11-06 NOTE — ED Notes (Signed)
MD at bedside. 

## 2015-11-06 NOTE — H&P (Signed)
[facility]          History and Physical    PCP:   Purvis Kilts, MD   EDP: Leonard Schwartz, M.D.  Chief Complaint:  I passed out  HPI: Patient is a 80 year old woman without medical history who presents to the hospital today after a syncopal episode. She states she awoke this morning to go to the bathroom and fell to the floor. No one was around hence she does not know how long she was unconscious, she does relate hitting her head. Upon arrival to the emergency department she is found to have a UTI, sound to be in atrial fibrillation with rapid ventricular response with rates in the 120s to 130s which is new onset for her, she was also found to have leukocytosis and acute renal failure. We have been asked to admit her for further evaluation and management.  Allergies:   Allergies  Allergen Reactions  . Penicillins       Past Medical History  Diagnosis Date  . Arthritis     Past Surgical History  Procedure Laterality Date  . Hip placement    . Total hip arthroplasty      Prior to Admission medications   Medication Sig Start Date End Date Taking? Authorizing Provider  ciprofloxacin (CIPRO) 500 MG tablet Take 500 mg by mouth 2 (two) times daily. 11/05/15  Yes Historical Provider, MD  naproxen sodium (ANAPROX) 220 MG tablet Take 220 mg by mouth 2 (two) times daily with a meal.   Yes Historical Provider, MD  aspirin EC 325 MG tablet Take 1 tablet (325 mg total) by mouth daily. Patient not taking: Reported on 11/06/2015 06/19/13   Varney Biles, MD    Social History:  reports that she has been smoking Cigarettes.  She does not have any smokeless tobacco history on file. She reports that she does not drink alcohol or use illicit drugs.  Family history: Patient is unable to tell me anything about her family history  Review of Systems:  Constitutional: Denies fever, chills, diaphoresis, appetite change and fatigue.  HEENT: Denies photophobia,  eye pain, redness, hearing loss, ear pain, congestion, sore throat, rhinorrhea, sneezing, mouth sores, trouble swallowing, neck pain, neck stiffness and tinnitus.   Respiratory: Denies SOB, DOE, cough, chest tightness,  and wheezing.   Cardiovascular: Denies chest pain, palpitations and leg swelling.  Gastrointestinal: Denies nausea, vomiting, abdominal pain, diarrhea, constipation, blood in stool and abdominal distention.  Genitourinary: Denies dysuria, urgency, frequency, hematuria, flank pain and difficulty urinating.  Endocrine: Denies: hot or cold intolerance, sweats, changes in hair or nails, polyuria, polydipsia. Musculoskeletal: Denies myalgias, back pain, joint swelling, arthralgias and gait problem.  Skin: Denies pallor, rash and wound.  Neurological: Denies dizziness, seizures, syncope, weakness, light-headedness, numbness and headaches.  Hematological: Denies adenopathy. Easy bruising, personal or family bleeding history  Psychiatric/Behavioral: Denies suicidal ideation, mood changes, confusion, nervousness, sleep disturbance and agitation   Physical Exam: Blood pressure 98/83, pulse 25, temperature 97.4 F (36.3 C), temperature source Oral, resp. rate 18, height '5\' 4"'  (1.626 m), weight 58.968 kg (130 lb), SpO2 98 %. General: Alert, awake, oriented 3, no current distress HEENT: Normocephalic, atraumatic, pupils equal round and reactive to light, extraocular movements intact Neck: Supple, no JVD, no lymphadenopathy, no bruits, no goiter. Cardiovascular: Tachycardic, irregular, I do not auscultate any murmurs rubs or gallops. Lungs: Clear to auscultation bilaterally. Abdomen: Soft, nontender, nondistended, positive  bowel sounds, no masses or organomegaly noted. Extremities: No clubbing, cyanosis or edema. Neurologic: Grossly intact and nonfocal although I have not ambulated her.   Labs on Admission:  Results for orders placed or performed during the hospital encounter of 11/06/15  (from the past 48 hour(s))  Basic metabolic panel     Status: Abnormal   Collection Time: 11/06/15 10:22 AM  Result Value Ref Range   Sodium 136 135 - 145 mmol/L   Potassium 4.5 3.5 - 5.1 mmol/L   Chloride 101 101 - 111 mmol/L   CO2 22 22 - 32 mmol/L   Glucose, Bld 104 (H) 65 - 99 mg/dL   BUN 44 (H) 6 - 20 mg/dL   Creatinine, Ser 1.44 (H) 0.44 - 1.00 mg/dL   Calcium 9.5 8.9 - 10.3 mg/dL   GFR calc non Af Amer 34 (L) >60 mL/min   GFR calc Af Amer 39 (L) >60 mL/min    Comment: (NOTE) The eGFR has been calculated using the CKD EPI equation. This calculation has not been validated in all clinical situations. eGFR's persistently <60 mL/min signify possible Chronic Kidney Disease.    Anion gap 13 5 - 15  CBC     Status: Abnormal   Collection Time: 11/06/15 10:22 AM  Result Value Ref Range   WBC 12.9 (H) 4.0 - 10.5 K/uL   RBC 4.96 3.87 - 5.11 MIL/uL   Hemoglobin 14.9 12.0 - 15.0 g/dL   HCT 44.9 36.0 - 46.0 %   MCV 90.5 78.0 - 100.0 fL   MCH 30.0 26.0 - 34.0 pg   MCHC 33.2 30.0 - 36.0 g/dL   RDW 14.8 11.5 - 15.5 %   Platelets 237 150 - 400 K/uL  CBG monitoring, ED     Status: None   Collection Time: 11/06/15 10:22 AM  Result Value Ref Range   Glucose-Capillary 88 65 - 99 mg/dL  Urinalysis, Routine w reflex microscopic (not at [facility])     Status: Abnormal   Collection Time: 11/06/15 11:43 AM  Result Value Ref Range   Color, Urine YELLOW YELLOW   APPearance CLOUDY (A) CLEAR   Specific Gravity, Urine 1.015 1.005 - 1.030   pH 8.5 (H) 5.0 - 8.0   Glucose, UA NEGATIVE NEGATIVE mg/dL   Hgb urine dipstick LARGE (A) NEGATIVE   Bilirubin Urine NEGATIVE NEGATIVE   Ketones, ur 15 (A) NEGATIVE mg/dL   Protein, ur 100 (A) NEGATIVE mg/dL   Nitrite POSITIVE (A) NEGATIVE   Leukocytes, UA SMALL (A) NEGATIVE  Urine microscopic-add on     Status: Abnormal   Collection Time: 11/06/15 11:43 AM  Result Value Ref Range   Squamous Epithelial / LPF 0-5 (A) NONE SEEN   WBC, UA 6-30 0 - 5 WBC/hpf    RBC / HPF TOO NUMEROUS TO COUNT 0 - 5 RBC/hpf   Bacteria, UA MANY (A) NONE SEEN   Crystals TRIPLE PHOSPHATE CRYSTALS (A) NEGATIVE    Radiological Exams on Admission: Ct Head Wo Contrast  11/06/2015  CLINICAL DATA:  Syncope with fall, hitting head EXAM: CT HEAD WITHOUT CONTRAST TECHNIQUE: Contiguous axial images were obtained from the base of the skull through the vertex without intravenous contrast. COMPARISON:  None. FINDINGS: There is moderate diffuse atrophy. There is no intracranial mass, hemorrhage, extra-axial fluid collection, or midline shift. There is moderate small vessel disease throughout the centra semiovale bilaterally. There is small vessel disease in the posterior limbs of each internal capsule. There is a small lacunar infarct in  the mid pons region, midline. No acute infarct evident. Bony calvarium appears intact. The mastoid air cells are clear. There is opacification of several ethmoid sinus regions bilaterally. No intraorbital lesions are identified. IMPRESSION: Atrophy with periventricular small vessel disease. Small vessel disease is also noted in each posterior limb internal capsule region. Prior small lacunar infarct mid pons region. No acute infarct evident. No hemorrhage or mass effect. There is patchy ethmoid sinus disease bilaterally. Electronically Signed   By: Lowella Grip III M.D.   On: 11/06/2015 13:31   Dg Chest Portable 1 View  11/06/2015  CLINICAL DATA:  Syncope this morning with fall EXAM: PORTABLE CHEST 1 VIEW COMPARISON:  06/19/2013 FINDINGS: Bilateral airway thickening is noted with retrocardiac airspace opacity on the left and possibly on the right. Borderline enlargement of the cardiopericardial silhouette. Mildly indistinct pulmonary vasculature. Atherosclerotic aortic arch.  No pneumothorax. IMPRESSION: 1. Mild bibasilar airspace opacities are suggested in the retrocardiac position, consider lateral radiography for confirmation. Atelectasis, pneumonia, or  aspiration pneumonitis could cause this appearance. 2. Airway thickening is present, suggesting bronchitis or reactive airways disease. 3. Borderline enlargement of the cardiopericardial silhouette. 4. Atherosclerotic aortic arch. Electronically Signed   By: Van Clines M.D.   On: 11/06/2015 11:15    Assessment/Plan Principal Problem:   Syncope Active Problems:   Atrial fibrillation with RVR (HCC)   Dehydration   UTI (lower urinary tract infection)   ARF (acute renal failure) (HCC)   Leukocytosis    Syncopal event -I suspect this is related to her new onset atrial fibrillation as well as possibly her UTI. -Nonetheless, will order a PT evaluation, will request 2-D echo to evaluate for valvular abnormalities, check TSH, check vitamin B-12, check orthostatic vital signs.  New onset atrial fibrillation with rapid ventricular response -Improved rate control while on Cardizem drip. We'll continue for now. -Check 2-D echo to assess for any structural abnormalities. -For now will place on prophylactic doses of Lovenox for DVT prophylaxis pending further discussions with the patient and further workup. She definitely qualifies for anticoagulation based on a CHADSVASC score of at least 2 given her age and gender. -If rates improved in a.m., will consider transitioning over to by mouth meds.  Urinary tract infection -Start Cipro pending culture data.  Acute renal failure -Suspect prerenal in origin. -Give IV fluids and recheck renal function in a.m.  Leukocytosis -Likely secondary to active infection and acute stress from acute illness.  DVT prophylaxis -Lovenox  CODE STATUS -Full code  Time Spent on Admission: 95 minutes  HERNANDEZ ACOSTA,ESTELA [facility] Pager: [phone] 11/06/2015, 5:40 PM

## 2015-11-06 NOTE — ED Provider Notes (Signed)
CSN: [id number]     Arrival date & time 11/06/15  1009 History  By signing my name below, I, Erling Conte, attest that this documentation has been prepared under the direction and in the presence of Stewart*. Electronically Signed: Erling Conte, ED Scribe. 11/06/2015. 2:00 PM.  Chief Complaint  Patient presents with  . Loss of Consciousness   No language interpreter was used.    HPI Comments: [name] is a 80 y.o. female who presents to the Emergency Department complaining a syncopal episode that occurred upon getting out of bed this morning. She states she was getting out of bed to use the restroom and upon standing she lost consciousness and fell beside her bed. Pt reports the episode was unwitnessed. She notes she hit her head during the fall. She denies any h/o falls in the past. Per EMS pt was prescribed Cipro by her PCP yesterday; pt endorses she takes a 325mg  aspirin daily but denies any other regular medication use. She denies any h/o cardiac issues. She denies any pain at this time. Pt denies any other associated symptoms.  Past Medical History  Diagnosis Date  . Arthritis    Past Surgical History  Procedure Laterality Date  . Hip placement    . Total hip arthroplasty     No family history on file. Social History  Substance Use Topics  . Smoking status: Current Every Day Smoker    Types: Cigarettes  . Smokeless tobacco: None  . Alcohol Use: No   OB History    No data available     Review of Systems  All other systems reviewed and are negative.     Allergies  Penicillins  Home Medications   Prior to Admission medications   Medication Sig Start Date End Date Taking? Authorizing Provider  ciprofloxacin (CIPRO) 500 MG tablet Take 500 mg by mouth 2 (two) times daily. 11/05/15  Yes Historical Provider, MD  naproxen sodium (ANAPROX) 220 MG tablet Take 220 mg by mouth 2 (two) times daily with a meal.   Yes Historical Provider, MD   aspirin EC 325 MG tablet Take 1 tablet (325 mg total) by mouth daily. Patient not taking: Reported on 11/06/2015 06/19/13   Varney Biles, MD   Triage Vitals: BP 122/98 mmHg  Pulse 135  Temp(Src) 97.1 F (36.2 C) (Oral)  Resp 16  Ht 5\' 4"  (1.626 m)  Wt 130 lb (58.968 kg)  BMI 22.30 kg/m2  SpO2 98%   Physical Exam Physical Exam  Nursing note and vitals reviewed. Constitutional: She is oriented to person, place, and time. She appears well-developed and well-nourished. No distress.  HENT:  Head: Normocephalic and atraumatic.  Eyes: Pupils are equal, round, and reactive to light.  Neck: Normal range of motion no tenderness to palpation.   Cardiovascular: Normal rate and intact distal pulses.   Pulmonary/Chest: No respiratory distress.  Abdominal: Normal appearance. She exhibits no distension.  Musculoskeletal: Normal range of motion.  Neurological: She is alert and oriented to person, place, and time. No cranial nerve deficit.  Skin: Skin is warm and dry. No rash noted.  Psychiatric: She has a normal mood and affect. Her behavior is normal.   ED Course  Procedures (including critical care time) Medications  diltiazem (CARDIZEM) 100 mg in dextrose 5 % 100 mL (1 mg/mL) infusion (5 mg/hr Intravenous New Bag/Given 11/06/15 1038)  diltiazem (CARDIZEM) 1 mg/mL load via infusion 10 mg (10 mg Intravenous Given 11/06/15 1039)  DIAGNOSTIC STUDIES: Oxygen Saturation is 98% on RA, normal by my interpretation.    COORDINATION OF CARE: 10:26 AM- Will order 12 lead EKG, BMP, CBC, UA. Pt advised of plan for treatment and pt agrees.   Labs Review Labs Reviewed  BASIC METABOLIC PANEL - Abnormal; Notable for the following:    Glucose, Bld 104 (*)    BUN 44 (*)    Creatinine, Ser 1.44 (*)    GFR calc non Af Amer 34 (*)    GFR calc Af Amer 39 (*)    All other components within normal limits  CBC - Abnormal; Notable for the following:    WBC 12.9 (*)    All other components within  normal limits  URINALYSIS, ROUTINE W REFLEX MICROSCOPIC (NOT AT [facility]) - Abnormal; Notable for the following:    APPearance CLOUDY (*)    pH 8.5 (*)    Hgb urine dipstick LARGE (*)    Ketones, ur 15 (*)    Protein, ur 100 (*)    Nitrite POSITIVE (*)    Leukocytes, UA SMALL (*)    All other components within normal limits  URINE MICROSCOPIC-ADD ON - Abnormal; Notable for the following:    Squamous Epithelial / LPF 0-5 (*)    Bacteria, UA MANY (*)    Crystals TRIPLE PHOSPHATE CRYSTALS (*)    All other components within normal limits  CBG MONITORING, ED    Imaging Review Ct Head Wo Contrast  11/06/2015  CLINICAL DATA:  Syncope with fall, hitting head EXAM: CT HEAD WITHOUT CONTRAST TECHNIQUE: Contiguous axial images were obtained from the base of the skull through the vertex without intravenous contrast. COMPARISON:  None. FINDINGS: There is moderate diffuse atrophy. There is no intracranial mass, hemorrhage, extra-axial fluid collection, or midline shift. There is moderate small vessel disease throughout the centra semiovale bilaterally. There is small vessel disease in the posterior limbs of each internal capsule. There is a small lacunar infarct in the mid pons region, midline. No acute infarct evident. Bony calvarium appears intact. The mastoid air cells are clear. There is opacification of several ethmoid sinus regions bilaterally. No intraorbital lesions are identified. IMPRESSION: Atrophy with periventricular small vessel disease. Small vessel disease is also noted in each posterior limb internal capsule region. Prior small lacunar infarct mid pons region. No acute infarct evident. No hemorrhage or mass effect. There is patchy ethmoid sinus disease bilaterally. Electronically Signed   By: Lowella Grip III M.D.   On: 11/06/2015 13:31   Dg Chest Portable 1 View  11/06/2015  CLINICAL DATA:  Syncope this morning with fall EXAM: PORTABLE CHEST 1 VIEW COMPARISON:  06/19/2013 FINDINGS:  Bilateral airway thickening is noted with retrocardiac airspace opacity on the left and possibly on the right. Borderline enlargement of the cardiopericardial silhouette. Mildly indistinct pulmonary vasculature. Atherosclerotic aortic arch.  No pneumothorax. IMPRESSION: 1. Mild bibasilar airspace opacities are suggested in the retrocardiac position, consider lateral radiography for confirmation. Atelectasis, pneumonia, or aspiration pneumonitis could cause this appearance. 2. Airway thickening is present, suggesting bronchitis or reactive airways disease. 3. Borderline enlargement of the cardiopericardial silhouette. 4. Atherosclerotic aortic arch. Electronically Signed   By: Van Clines M.D.   On: 11/06/2015 11:15   I have personally reviewed and evaluated these images and lab results as part of my medical decision-making.   EKG Interpretation   Date/Time:  Friday November 06 2015 10:14:45 EST Ventricular Rate:  128 PR Interval:    QRS Duration: 64 QT Interval:  324 QTC Calculation: 473 R Axis:   -18 Text Interpretation:  Atrial fibrillation Borderline left axis deviation  Anteroseptal infarct, old Nonspecific T abnormalities, lateral leads  Abnormal ekg Confirmed by Loghan Subia  MD, Denman Pichardo ([id number]) on 11/06/2015  10:17:18 AM     CRITICAL CARE Performed by: Dot Lanes Total critical care time: 30 minutes Critical care time was exclusive of separately billable procedures and treating other patients. Critical care was necessary to treat or prevent imminent or life-threatening deterioration. Critical care was time spent personally by me on the following activities: development of treatment plan with patient and/or surrogate as well as nursing, discussions with consultants, evaluation of patient's response to treatment, examination of patient, obtaining history from patient or surrogate, ordering and performing treatments and interventions, ordering and review of laboratory studies,  ordering and review of radiographic studies, pulse oximetry and re-evaluation of patient's condition.  MDM   Final diagnoses:  Atrial fibrillation with rapid ventricular response (HCC)  Dehydration   I personally performed the services described in this documentation, which was scribed in my presence. The recorded information has been reviewed and considered.     Leonard Schwartz, MD 11/06/15 [phone]

## 2015-11-07 ENCOUNTER — Inpatient Hospital Stay (HOSPITAL_COMMUNITY): Payer: Medicare HMO

## 2015-11-07 ENCOUNTER — Encounter (HOSPITAL_COMMUNITY): Payer: Self-pay | Admitting: Emergency Medicine

## 2015-11-07 DIAGNOSIS — I472 Ventricular tachycardia, unspecified: Secondary | ICD-10-CM

## 2015-11-07 DIAGNOSIS — I4891 Unspecified atrial fibrillation: Secondary | ICD-10-CM

## 2015-11-07 DIAGNOSIS — I429 Cardiomyopathy, unspecified: Secondary | ICD-10-CM

## 2015-11-07 LAB — CBC
HCT: 41.1 % (ref 36.0–46.0)
Hemoglobin: 13.3 g/dL (ref 12.0–15.0)
MCH: 29.3 pg (ref 26.0–34.0)
MCHC: 32.4 g/dL (ref 30.0–36.0)
MCV: 90.5 fL (ref 78.0–100.0)
PLATELETS: 217 10*3/uL (ref 150–400)
RBC: 4.54 MIL/uL (ref 3.87–5.11)
RDW: 15 % (ref 11.5–15.5)
WBC: 9.7 10*3/uL (ref 4.0–10.5)

## 2015-11-07 LAB — BASIC METABOLIC PANEL
Anion gap: 10 (ref 5–15)
BUN: 33 mg/dL — AB (ref 6–20)
CALCIUM: 8.4 mg/dL — AB (ref 8.9–10.3)
CHLORIDE: 108 mmol/L (ref 101–111)
CO2: 20 mmol/L — AB (ref 22–32)
CREATININE: 1.1 mg/dL — AB (ref 0.44–1.00)
GFR calc Af Amer: 54 mL/min — ABNORMAL LOW (ref >60)
GFR calc non Af Amer: 47 mL/min — ABNORMAL LOW (ref >60)
Glucose, Bld: 97 mg/dL (ref 65–99)
Potassium: 3.6 mmol/L (ref 3.5–5.1)
SODIUM: 138 mmol/L (ref 135–145)

## 2015-11-07 LAB — MRSA PCR SCREENING: MRSA by PCR: NEGATIVE

## 2015-11-07 LAB — VITAMIN B12: VITAMIN B 12: 227 pg/mL (ref 180–914)

## 2015-11-07 LAB — TROPONIN I
Troponin I: 0.05 ng/mL — ABNORMAL HIGH (ref <0.031)
Troponin I: 0.04 ng/mL — ABNORMAL HIGH (ref <0.031)
Troponin I: 0.1 ng/mL — ABNORMAL HIGH (ref <0.031)

## 2015-11-07 MED ORDER — ASPIRIN EC 81 MG PO TBEC
81.0000 mg | DELAYED_RELEASE_TABLET | Freq: Every day | ORAL | Status: DC
  Administered 2015-11-07 – 2015-11-13 (×6): 81 mg via ORAL
  Filled 2015-11-07 (×7): qty 1

## 2015-11-07 MED ORDER — METOPROLOL TARTRATE 25 MG PO TABS
25.0000 mg | ORAL_TABLET | Freq: Two times a day (BID) | ORAL | Status: DC
  Administered 2015-11-07 – 2015-11-08 (×3): 25 mg via ORAL
  Filled 2015-11-07 (×3): qty 1

## 2015-11-07 MED ORDER — HEPARIN (PORCINE) IN NACL 100-0.45 UNIT/ML-% IJ SOLN
750.0000 [IU]/h | INTRAMUSCULAR | Status: DC
  Administered 2015-11-07: 700 [IU]/h via INTRAVENOUS
  Filled 2015-11-07: qty 250

## 2015-11-07 MED ORDER — ENOXAPARIN SODIUM 40 MG/0.4ML ~~LOC~~ SOLN: 40.0000 mg | SUBCUTANEOUS | Status: DC

## 2015-11-07 MED ORDER — HEPARIN BOLUS VIA INFUSION
3600.0000 [IU] | Freq: Once | INTRAVENOUS | Status: AC
  Administered 2015-11-07: 3600 [IU] via INTRAVENOUS
  Filled 2015-11-07: qty 3600

## 2015-11-07 MED ORDER — FUROSEMIDE 10 MG/ML IJ SOLN
20.0000 mg | Freq: Once | INTRAMUSCULAR | Status: AC
  Administered 2015-11-07: 20 mg via INTRAVENOUS
  Filled 2015-11-07: qty 2

## 2015-11-07 NOTE — Evaluation (Signed)
Physical Therapy Evaluation Patient Details Name: [name] MRN: [id number] DOB: June 10, 1935 Today's Date: 11/07/2015   History of Present Illness  Patient is a 80 year old woman without medical history who presents to the hospital today after a syncopal episode. She states she awoke this morning to go to the bathroom and fell to the floor. No one was around hence she does not know how long she was unconscious, she does relate hitting her head. Upon arrival to the emergency department she is found to have a UTI, sound to be in atrial fibrillation with rapid ventricular response with rates in the 120s to 130s which is new onset for her, she was also found to have leukocytosis and acute renal failure. We have been asked to admit her for further evaluation and management.  She lives alone and is independent with a walker.  She reports having had multiple falls recently.  Clinical Impression  Pt was seen for evaluation as an "imminent discharge".  We initiated the eval, pt supine in bed with stable vital signs.  She was alert but confused, able to follow all directions.  Eval demonstrated significant weakness and deconditioning during muscle test.  Before beginning mobility testing, RN let me know that ECHO results had just arrived and pt has had a decline in EF.  She is therefore to be transferred to [facility] for further care.  PT was ended at that point.  Pt was left comfortable and supine in bed, stable vital signs.    Follow Up Recommendations  (to be determined at next venue)    Equipment Recommendations    none   Recommendations for Other Services   to be determined    Precautions / Restrictions Precautions Precautions: Fall Restrictions Weight Bearing Restrictions: No      Mobility  Bed Mobility               General bed mobility comments: not tested due to medical situation  Transfers                    Ambulation/Gait                Stairs             Wheelchair Mobility    Modified Rankin (Stroke Patients Only)       Balance Overall balance assessment:  (unable to test due to medical situation)                                           Pertinent Vitals/Pain Pain Assessment: No/denies pain    Home Living Family/patient expects to be discharged to:: Unsure Living Arrangements: Alone               Additional Comments: pt is to be transferred to [facility] due to significant decline in her ECHO (determined during the PT eval).Marland KitchenMarland KitchenPT eval demonstrates profound weakness throughout, pt is confused and lives alone...unless tx at [facility] improves this significantly, she will probably need transfer to SNF at d/c    Prior Function Level of Independence: Independent with assistive device(s)         Comments: usually ambulated with a walker     Hand Dominance        Extremity/Trunk Assessment   Upper Extremity Assessment: Generalized weakness           Lower Extremity Assessment: Generalized weakness (  anterior tibialis weaker on left (2+/5))         Communication   Communication: No difficulties (occasional slurring of speech)  Cognition Arousal/Alertness: Awake/alert Behavior During Therapy: Flat affect Overall Cognitive Status: No family/caregiver present to determine baseline cognitive functioning (pt appears to be confused, disoriented to place, time and situation)                      General Comments      Exercises        Assessment/Plan    PT Assessment All further PT needs can be met in the next venue of care  PT Diagnosis Generalized weakness   PT Problem List Decreased strength;Decreased activity tolerance;Decreased mobility;Cardiopulmonary status limiting activity  PT Treatment Interventions     PT Goals (Current goals can be found in the Care Plan section) Acute Rehab PT Goals PT Goal Formulation: All assessment and education complete, DC therapy    Frequency      Barriers to discharge        Co-evaluation               End of Session   Activity Tolerance: Patient limited by fatigue;Treatment limited secondary to medical complications (Comment) Patient left: in bed;with call bell/phone within reach;with nursing/sitter in room Nurse Communication: Mobility status         Time: 1445-1510 PT Time Calculation (min) (ACUTE ONLY): 25 min   Charges:   PT Evaluation $Initial PT Evaluation Tier I: 1 Procedure     PT G CodesDemetrios Isaacs L  PT 11/07/2015, 3:20 PM [phone]

## 2015-11-07 NOTE — Progress Notes (Signed)
*  PRELIMINARY RESULTS* Echocardiogram 2D Echocardiogram has been performed.  Beryle Beams 11/07/2015, 9:11 AM

## 2015-11-07 NOTE — Progress Notes (Addendum)
[facility] PROGRESS NOTE  [name] [id number] DOB: Nov 20, 1935 DOA: 11/06/2015 PCP: Purvis Kilts, MD   Assessment/Plan: Syncopal episode -I believe this is related to her arrhythmias that we have witnessed on telemetry. -Please see below for details.  New onset atrial fibrillation with rapid ventricular response -Currently rate controlled. -We'll start metoprolol and start weaning Cardizem drip. -Has a CHADSVASC score of at least 2 given her age and gender, currently is a big fall risk she lives independently and is a 2 person assist. Unless she has some major structural abnormalities on echo, I would opt for simply doing aspirin instead of full-blown anticoagulation.  Nonsustained ventricular tachycardia -Has been having multiple episodes of ventricular ectopy on telemetry, longest around 35 beats (asymptomatic). -We'll start metoprolol. -Await results of 2-D echo: Discussed with Dr. Meda Coffee, cardiologist on call, recommends await results of echo, if depressed EF transfer to [facility] for potential cath, if normal EF can consider discharge home on beta blockade and close follow-up with cardiology outpatient.  UTI -Continue Cipro pending culture data  Acute renal failure -Improved with IV fluids  Leukocytosis -Resolved  Code Status: Full code Family Communication: Patient only  Disposition Plan: To be determined   Consultants:  Cardiology via phone   Antibiotics:  Cipro   Subjective: "I need to go home to pay bills"  Objective: Filed Vitals:   11/07/15 0600 11/07/15 0615 11/07/15 0630 11/07/15 0826  BP: 138/82 125/80 120/77   Pulse: 69 90 86   Temp:    97.6 F (36.4 C)  TempSrc:    Axillary  Resp: 17 16 15    Height:      Weight:      SpO2: 97% 98% 98%     Intake/Output Summary (Last 24 hours) at 11/07/15 1108 Last data filed at 11/07/15 0600  Gross per 24 hour  Intake 1288.08 ml  Output    150 ml  Net 1138.08 ml   Filed  Weights   11/06/15 1012 11/07/15 0511  Weight: 58.968 kg (130 lb) 59.6 kg (131 lb 6.3 oz)    Exam:   General:  Alert, awake, oriented 3  Cardiovascular: Irregular, no rubs or gallops or murmurs  Respiratory: Clear to auscultation bilaterally  Abdomen: Soft, nontender, nondistended, positive bowel sounds  Extremities: No clubbing, cyanosis or edema   Neurologic:  Grossly intact and nonfocal  Data Reviewed: Basic Metabolic Panel:  Recent Labs Lab 11/06/15 1022 11/07/15 0433  NA 136 138  K 4.5 3.6  CL 101 108  CO2 22 20*  GLUCOSE 104* 97  BUN 44* 33*  CREATININE 1.44* 1.10*  CALCIUM 9.5 8.4*   Liver Function Tests: No results for input(s): AST, ALT, ALKPHOS, BILITOT, PROT, ALBUMIN in the last 168 hours. No results for input(s): LIPASE, AMYLASE in the last 168 hours. No results for input(s): AMMONIA in the last 168 hours. CBC:  Recent Labs Lab 11/06/15 1022 11/07/15 0433  WBC 12.9* 9.7  HGB 14.9 13.3  HCT 44.9 41.1  MCV 90.5 90.5  PLT 237 217   Cardiac Enzymes:  Recent Labs Lab 11/06/15 1932 11/07/15 0048 11/07/15 0701  TROPONINI 0.05* 0.05* 0.04*   BNP (last 3 results) No results for input(s): BNP in the last 8760 hours.  ProBNP (last 3 results) No results for input(s): PROBNP in the last 8760 hours.  CBG:  Recent Labs Lab 11/06/15 1022  GLUCAP 88    No results found for this or any previous visit (from the past 240  hour(s)).   Studies: Ct Head Wo Contrast  11/06/2015  CLINICAL DATA:  Syncope with fall, hitting head EXAM: CT HEAD WITHOUT CONTRAST TECHNIQUE: Contiguous axial images were obtained from the base of the skull through the vertex without intravenous contrast. COMPARISON:  None. FINDINGS: There is moderate diffuse atrophy. There is no intracranial mass, hemorrhage, extra-axial fluid collection, or midline shift. There is moderate small vessel disease throughout the centra semiovale bilaterally. There is small vessel disease in the  posterior limbs of each internal capsule. There is a small lacunar infarct in the mid pons region, midline. No acute infarct evident. Bony calvarium appears intact. The mastoid air cells are clear. There is opacification of several ethmoid sinus regions bilaterally. No intraorbital lesions are identified. IMPRESSION: Atrophy with periventricular small vessel disease. Small vessel disease is also noted in each posterior limb internal capsule region. Prior small lacunar infarct mid pons region. No acute infarct evident. No hemorrhage or mass effect. There is patchy ethmoid sinus disease bilaterally. Electronically Signed   By: Lowella Grip III M.D.   On: 11/06/2015 13:31   Dg Chest Portable 1 View  11/06/2015  CLINICAL DATA:  Syncope this morning with fall EXAM: PORTABLE CHEST 1 VIEW COMPARISON:  06/19/2013 FINDINGS: Bilateral airway thickening is noted with retrocardiac airspace opacity on the left and possibly on the right. Borderline enlargement of the cardiopericardial silhouette. Mildly indistinct pulmonary vasculature. Atherosclerotic aortic arch.  No pneumothorax. IMPRESSION: 1. Mild bibasilar airspace opacities are suggested in the retrocardiac position, consider lateral radiography for confirmation. Atelectasis, pneumonia, or aspiration pneumonitis could cause this appearance. 2. Airway thickening is present, suggesting bronchitis or reactive airways disease. 3. Borderline enlargement of the cardiopericardial silhouette. 4. Atherosclerotic aortic arch. Electronically Signed   By: Van Clines M.D.   On: 11/06/2015 11:15    Scheduled Meds: . sodium chloride   Intravenous STAT  . ciprofloxacin  400 mg Intravenous Q12H  . enoxaparin (LOVENOX) injection  30 mg Subcutaneous Q24H  . metoprolol tartrate  25 mg Oral BID   Continuous Infusions: . sodium chloride 75 mL/hr at 11/07/15 0000    Principal Problem:   Syncope Active Problems:   Atrial fibrillation with RVR (HCC)    Dehydration   UTI (lower urinary tract infection)   ARF (acute renal failure) (HCC)   Leukocytosis    Time spent: 30 minutes. Greater than 50% of this time was spent in direct contact with the patient coordinating care.    Lelon Frohlich  [facility] Pager [phone]  If 7PM-7AM, please contact night-coverage at [email or web address], password [facility] 11/07/2015, 11:08 AM  LOS: 1 day      Addendum: Echo shows ejection fraction of 20% with diffuse hypokinesis. Patient has continued to have episodes of nonsustained ventricular tachycardia on telemetry. Troponin is now increasing from 0.04-0.10. Will start heparin drip. Discussed with Dr. Meda Coffee, cardiologist on call at [facility], will plan on transferring patient for further cardiac workup given lack of cardiology coverage at [facility]. Unable to find an actual cath report, however there is a note from 2011 by Dr. Quay Burow that makes mention of a nonischemic cardiomyopathy and a 20-30% LAD lesion on cath.

## 2015-11-07 NOTE — Progress Notes (Signed)
Pt has a run of V-tach >20 beats.  MD was notified, and they consulted cardiology. Orders entered by MD.

## 2015-11-07 NOTE — Progress Notes (Signed)
ANTICOAGULATION CONSULT NOTE - Initial Consult  Pharmacy Consult for Heparin Indication: chest pain/ACS  Allergies  Allergen Reactions  . Penicillins     Patient Measurements: Height: 5\' 4"  (162.6 cm) Weight: 131 lb 6.3 oz (59.6 kg) IBW/kg (Calculated) : 54.7 Heparin Dosing Weight: 60kg  Vital Signs: Temp: 97.6 F (36.4 C) (12/17 0826) Temp Source: Axillary (12/17 0826) BP: 122/94 mmHg (12/17 1430) Pulse Rate: 55 (12/17 1345)  Labs:  Recent Labs  11/06/15 1022  11/07/15 0048 11/07/15 0433 11/07/15 0701 11/07/15 1307  HGB 14.9  --   --  13.3  --   --   HCT 44.9  --   --  41.1  --   --   PLT 237  --   --  217  --   --   CREATININE 1.44*  --   --  1.10*  --   --   TROPONINI  --   < > 0.05*  --  0.04* 0.10*  < > = values in this interval not displayed.  Estimated Creatinine Clearance: 36.4 mL/min (by C-G formula based on Cr of 1.1).   Medical History: Past Medical History  Diagnosis Date  . Arthritis     Medications:  Prescriptions prior to admission  Medication Sig Dispense Refill Last Dose  . ciprofloxacin (CIPRO) 500 MG tablet Take 500 mg by mouth 2 (two) times daily.   11/05/2015 at Unknown time  . naproxen sodium (ANAPROX) 220 MG tablet Take 220 mg by mouth 2 (two) times daily with a meal.   11/05/2015 at Unknown time  . aspirin EC 325 MG tablet Take 1 tablet (325 mg total) by mouth daily. (Patient not taking: Reported on 11/06/2015) 30 tablet 0 Not Taking at Unknown time   Asssessment: 80 year old woman withou tsignificant  medical history who presents to the hospital today after a syncopal episode. She states she awoke this morning to go to the bathroom and fell to the floor. No one was around hence she does not know how long she was unconscious, she does relate hitting her head. Upon arrival to the emergency department she is found to have a UTI, sound to be in atrial fibrillation with rapid ventricular response with rates in the 120s to 130s which is new  onset for her, she was also found to have leukocytosis and acute renal failure. She has having episodes of ventricular ectopy on telemetry. ECHO reported as decline in EF. Initiate heparin infusion for ACS.  Goal of Therapy:  Heparin level 0.3-0.7 units/ml Monitor platelets by anticoagulation protocol: Yes   Plan:  Give 3600 units bolus x 1 Start heparin infusion at 700 units/hr Check anti-Xa level in 8 hours and daily while on heparin Continue to monitor H&H and platelets  Isac Sarna, BS Vena Austria, BCPS Clinical Pharmacist Pager [phone] 11/07/2015,3:39 PM

## 2015-11-07 NOTE — H&P (Signed)
History & Physical    Patient ID: [name] MRN: [id number], DOB/AGE: 1935-06-19   Admit date: 11/06/2015   Primary Physician: Purvis Kilts, MD Primary Cardiologist: Gwenlyn Found  Patient Profile    CC: Syncope, atrial fibrillation  Past Medical History    Past Medical History  Diagnosis Date  . Arthritis     Past Surgical History  Procedure Laterality Date  . Hip placement    . Total hip arthroplasty       Allergies  Allergies  Allergen Reactions  . Penicillins     History of Present Illness    The patient is a 4F with no significant PMH who presented to [facility] after a syncopal episode. She notes thatyesterday she was at home and suddenly passed out on her way to the bathroom. Her episode was not witnessed. She does not believe there was a prodrome of dizziness/LH. She is unsure of whether she hit her head though she states that she may have. She was brought via EMS to the ED at [facility]. There, she was found to have a UTI but also was found to be in rapid AF with rates in the 120-130s. She also had low-level troponin elevation.   She was successfully rate controlled but underwent TTE that revealed LVEF 20% with global HK. She also was observed on telemetry and had at least one episode of 30 beats NSVT and another of 4 beats. Given these abnormalities and her initial presentation she was transferred to [facility] for additional evaluation and management.   Cardiovascular risk factors: HTN: N HLD: N DM: N Smoker: 1ppdx15 years Prior cardiac workup:  She apparently had a TTE on 06/07/2009 that demonstrated LVEF 35-40%. She also had cardiac catheterization that revealed LAD 20-30 (though 50% in 1 view) though these records are not available  Home Medications    Prior to Admission medications   Medication Sig Start Date End Date Taking? Authorizing Provider  ciprofloxacin (CIPRO) 500 MG tablet Take 500 mg by mouth 2 (two) times daily.  11/05/15  Yes Historical Provider, MD  naproxen sodium (ANAPROX) 220 MG tablet Take 220 mg by mouth 2 (two) times daily with a meal.   Yes Historical Provider, MD  aspirin EC 325 MG tablet Take 1 tablet (325 mg total) by mouth daily. Patient not taking: Reported on 11/06/2015 06/19/13   Varney Biles, MD    Family History    History reviewed. No pertinent family history. No history of SCD  Social History    Social History   Social History  . Marital Status: Widowed    Spouse Name: N/A  . Number of Children: N/A  . Years of Education: N/A   Occupational History  . Not on file.   Social History Main Topics  . Smoking status: Current Every Day Smoker    Types: Cigarettes  . Smokeless tobacco: Not on file  . Alcohol Use: No  . Drug Use: No  . Sexual Activity: No   Other Topics Concern  . Not on file   Social History Narrative     Review of Systems    General:  No chills, fever, night sweats or weight changes.  Cardiovascular:  No chest pain, dyspnea on exertion, edema, orthopnea, palpitations, paroxysmal nocturnal dyspnea. Dermatological: No rash, lesions/masses Respiratory: No cough, +dyspnea Urologic: No hematuria, dysuria Abdominal:   No nausea, vomiting, diarrhea, bright red blood per rectum, melena, or hematemesis Neurologic:  No visual changes, wkns, changes in  mental status. All other systems reviewed and are otherwise negative except as noted above.  Physical Exam    Blood pressure 123/86, pulse 76, temperature 97.6 F (36.4 C), temperature source Axillary, resp. rate 20, height 5\' 4"  (1.626 m), weight 131 lb 2.8 oz (59.5 kg), SpO2 97 %.  General: Pleasant, NAD Psych: Normal affect. Neuro: Alert and oriented X 3. Moves all extremities spontaneously. HEENT: Normal  Neck: Supple without bruits or JVD. Lungs:  Resp regular and unlabored, CTA. Heart: IRIR no s3, s4, or murmurs. Abdomen: Soft, non-tender, non-distended, BS + x 4.  Extremities: No clubbing,  cyanosis or edema. DP/PT/Radials 2+ and equal bilaterally.  Labs    Troponin (Point of Care Test) No results for input(s): TROPIPOC in the last 72 hours.  Recent Labs  11/06/15 1932 11/07/15 0048 11/07/15 0701 11/07/15 1307  TROPONINI 0.05* 0.05* 0.04* 0.10*   Lab Results  Component Value Date   WBC 9.7 11/07/2015   HGB 13.3 11/07/2015   HCT 41.1 11/07/2015   MCV 90.5 11/07/2015   PLT 217 11/07/2015    Recent Labs Lab 11/07/15 0433  NA 138  K 3.6  CL 108  CO2 20*  BUN 33*  CREATININE 1.10*  CALCIUM 8.4*  GLUCOSE 97   Lab Results  Component Value Date   CHOL  06/11/2009    192        ATP III CLASSIFICATION:  <200     mg/dL   Desirable  200-239  mg/dL   Borderline High  >=240    mg/dL   High          HDL 63 06/11/2009   LDLCALC * 06/11/2009    111        Total Cholesterol/HDL:CHD Risk Coronary Heart Disease Risk Table                     Men   Women  1/2 Average Risk   3.4   3.3  Average Risk       5.0   4.4  2 X Average Risk   9.6   7.1  3 X Average Risk  23.4   11.0        Use the calculated Patient Ratio above and the CHD Risk Table to determine the patient's CHD Risk.        ATP III CLASSIFICATION (LDL):  <100     mg/dL   Optimal  100-129  mg/dL   Near or Above                    Optimal  130-159  mg/dL   Borderline  160-189  mg/dL   High  >190     mg/dL   Very High   TRIG 91 06/11/2009   No results found for: [facility]   Radiology Studies    Ct Head Wo Contrast  11/06/2015  CLINICAL DATA:  Syncope with fall, hitting head EXAM: CT HEAD WITHOUT CONTRAST TECHNIQUE: Contiguous axial images were obtained from the base of the skull through the vertex without intravenous contrast. COMPARISON:  None. FINDINGS: There is moderate diffuse atrophy. There is no intracranial mass, hemorrhage, extra-axial fluid collection, or midline shift. There is moderate small vessel disease throughout the centra semiovale bilaterally. There is small vessel disease in  the posterior limbs of each internal capsule. There is a small lacunar infarct in the mid pons region, midline. No acute infarct evident. Bony calvarium appears intact. The mastoid air cells are  clear. There is opacification of several ethmoid sinus regions bilaterally. No intraorbital lesions are identified. IMPRESSION: Atrophy with periventricular small vessel disease. Small vessel disease is also noted in each posterior limb internal capsule region. Prior small lacunar infarct mid pons region. No acute infarct evident. No hemorrhage or mass effect. There is patchy ethmoid sinus disease bilaterally. Electronically Signed   By: Lowella Grip III M.D.   On: 11/06/2015 13:31   Dg Chest Portable 1 View  11/06/2015  CLINICAL DATA:  Syncope this morning with fall EXAM: PORTABLE CHEST 1 VIEW COMPARISON:  06/19/2013 FINDINGS: Bilateral airway thickening is noted with retrocardiac airspace opacity on the left and possibly on the right. Borderline enlargement of the cardiopericardial silhouette. Mildly indistinct pulmonary vasculature. Atherosclerotic aortic arch.  No pneumothorax. IMPRESSION: 1. Mild bibasilar airspace opacities are suggested in the retrocardiac position, consider lateral radiography for confirmation. Atelectasis, pneumonia, or aspiration pneumonitis could cause this appearance. 2. Airway thickening is present, suggesting bronchitis or reactive airways disease. 3. Borderline enlargement of the cardiopericardial silhouette. 4. Atherosclerotic aortic arch. Electronically Signed   By: Van Clines M.D.   On: 11/06/2015 11:15    ECG & Cardiac Imaging    AF at 128bpm, normal axis, intervals, TWI in anterior leads, no acute ST-TW changes  Assessment & Plan    1. Atrial fibrillation 2. Nonsustained VT 3. Cardiomyopathy  The patient is a 49F with no significant PMH who presented to [facility] after a syncopal episode. Workup revealed mildly elevated troponin. TTE revealed LVEF  20%. She is mildly volume up on examination. It is difficult to elicit a clear history from her but her syncopal episode is concerning for a cardiac etiology. She has new low LVEF and episodes of nonsustained VT documented on telemetry. She will likely need additional evaluation re: her LVEF 20%, though it appears that it was not normal back in 2010. She is not on any guideline recommended medical therapies currently. Re: AF, she is at moderately elevated stroke risk with CHADS-VASc of at least 4 (agex2, female, CHF). She will be anticoagulated with heparin during the hospitalization but upon discharge would benefit from more robust anticoagulation, though she will need a clear discussion re: risks and benefits given her frequent falls  -metoprolol 25mg  bid for rate control -heparin gtt for anticoagulation -holding off on ACEi for now given her AKI - this should be started prior to discharge -plan for additional evaluation (SPECT vs cath) on Monday - I favor cath given her mild-moderate disease in the past -once ischemic evaluation is complete, consider EP evaluation for her syncope, though she will need to be on GDMT for her heart failure for at least three months prior.  Signed, Raliegh Ip, MD MPH 11/07/2015, 6:52 PM

## 2015-11-08 ENCOUNTER — Inpatient Hospital Stay (HOSPITAL_COMMUNITY): Payer: Medicare HMO

## 2015-11-08 DIAGNOSIS — R55 Syncope and collapse: Secondary | ICD-10-CM

## 2015-11-08 DIAGNOSIS — I4891 Unspecified atrial fibrillation: Principal | ICD-10-CM

## 2015-11-08 LAB — BASIC METABOLIC PANEL
Anion gap: 9 (ref 5–15)
BUN: 21 mg/dL — ABNORMAL HIGH (ref 6–20)
CO2: 19 mmol/L — ABNORMAL LOW (ref 22–32)
Calcium: 8.4 mg/dL — ABNORMAL LOW (ref 8.9–10.3)
Chloride: 108 mmol/L (ref 101–111)
Creatinine, Ser: 0.95 mg/dL (ref 0.44–1.00)
GFR calc Af Amer: >60 mL/min (ref >60)
GFR calc non Af Amer: 56 mL/min — ABNORMAL LOW (ref >60)
Glucose, Bld: 123 mg/dL — ABNORMAL HIGH (ref 65–99)
Potassium: 3.6 mmol/L (ref 3.5–5.1)
Sodium: 136 mmol/L (ref 135–145)

## 2015-11-08 LAB — CBC
HEMATOCRIT: 40.7 % (ref 36.0–46.0)
Hemoglobin: 13.2 g/dL (ref 12.0–15.0)
MCH: 29.5 pg (ref 26.0–34.0)
MCHC: 32.4 g/dL (ref 30.0–36.0)
MCV: 91.1 fL (ref 78.0–100.0)
Platelets: 207 10*3/uL (ref 150–400)
RBC: 4.47 MIL/uL (ref 3.87–5.11)
RDW: 15.1 % (ref 11.5–15.5)
WBC: 9.4 10*3/uL (ref 4.0–10.5)

## 2015-11-08 LAB — HEPARIN LEVEL (UNFRACTIONATED)
Heparin Unfractionated: 0.39 IU/mL (ref 0.30–0.70)
Heparin Unfractionated: 0.3 IU/mL (ref 0.30–0.70)

## 2015-11-08 MED ORDER — DIGOXIN 125 MCG PO TABS
0.0625 mg | ORAL_TABLET | Freq: Every day | ORAL | Status: DC
  Administered 2015-11-08 – 2015-11-13 (×5): 0.0625 mg via ORAL
  Filled 2015-11-08 (×6): qty 1

## 2015-11-08 MED ORDER — HALOPERIDOL LACTATE 5 MG/ML IJ SOLN
2.0000 mg | Freq: Four times a day (QID) | INTRAMUSCULAR | Status: DC | PRN
  Administered 2015-11-08 – 2015-11-11 (×2): 2 mg via INTRAVENOUS
  Filled 2015-11-08 (×2): qty 1

## 2015-11-08 MED ORDER — ALPRAZOLAM 0.25 MG PO TABS
0.2500 mg | ORAL_TABLET | Freq: Once | ORAL | Status: AC
  Administered 2015-11-08: 0.25 mg via ORAL
  Filled 2015-11-08: qty 1

## 2015-11-08 MED ORDER — HEPARIN (PORCINE) IN NACL 100-0.45 UNIT/ML-% IJ SOLN
900.0000 [IU]/h | INTRAMUSCULAR | Status: DC
  Administered 2015-11-09: 900 [IU]/h via INTRAVENOUS
  Filled 2015-11-08 (×2): qty 250

## 2015-11-08 MED ORDER — LEVALBUTEROL HCL 1.25 MG/0.5ML IN NEBU
1.2500 mg | INHALATION_SOLUTION | Freq: Four times a day (QID) | RESPIRATORY_TRACT | Status: DC | PRN
  Administered 2015-11-08: 1.25 mg via RESPIRATORY_TRACT
  Filled 2015-11-08: qty 0.5

## 2015-11-08 MED ORDER — CIPROFLOXACIN HCL 250 MG PO TABS
250.0000 mg | ORAL_TABLET | Freq: Two times a day (BID) | ORAL | Status: DC
  Administered 2015-11-08 – 2015-11-09 (×2): 250 mg via ORAL
  Filled 2015-11-08 (×5): qty 1

## 2015-11-08 MED ORDER — DIGOXIN 0.25 MG/ML IJ SOLN
0.2500 mg | Freq: Four times a day (QID) | INTRAMUSCULAR | Status: AC
  Administered 2015-11-08 – 2015-11-09 (×4): 0.25 mg via INTRAVENOUS
  Filled 2015-11-08 (×4): qty 2

## 2015-11-08 MED ORDER — METOPROLOL TARTRATE 25 MG PO TABS
25.0000 mg | ORAL_TABLET | Freq: Four times a day (QID) | ORAL | Status: DC
  Administered 2015-11-08 – 2015-11-11 (×13): 25 mg via ORAL
  Filled 2015-11-08 (×15): qty 1

## 2015-11-08 NOTE — Progress Notes (Signed)
Pt is confused,and has become increasingly agitated to the point she is now combative, thinks she is home and wants to go outside. She has pulled out an IV. NP called for orders. Etta Quill

## 2015-11-08 NOTE — Progress Notes (Signed)
Md called.  Pt hr 106-130's afib.   bp 141/99 Pt asymptomatic.  Will continue to monitor. Saunders Revel T

## 2015-11-08 NOTE — Progress Notes (Signed)
Pt has been confused and combative today,unable to obtain orthostatic vitals.Etta Quill

## 2015-11-08 NOTE — Progress Notes (Signed)
Pt sob wheezing, rhonci , 02 sats 97 on 2lnc.  md notified.  Will continue to monitor. Saunders Revel T

## 2015-11-08 NOTE — Progress Notes (Signed)
ANTICOAGULATION CONSULT NOTE - Follow Up Consult  Pharmacy Consult for heparin Indication: chest pain/ACS and atrial fibrillation  Allergies  Allergen Reactions  . Penicillins     Patient Measurements: Height: 5\' 4"  (162.6 cm) Weight: 131 lb 2.8 oz (59.5 kg) IBW/kg (Calculated) : 54.7 Heparin Dosing Weight: 60kg  Vital Signs: Temp: 97.2 F (36.2 C) (12/18 0826) Temp Source: Oral (12/18 0826) BP: 125/88 mmHg (12/18 0826) Pulse Rate: 120 (12/18 0826)  Labs:  Recent Labs  11/06/15 1022  11/07/15 0048 11/07/15 0433 11/07/15 0701 11/07/15 1307 11/08/15 0027 11/08/15 0932  HGB 14.9  --   --  13.3  --   --  13.2  --   HCT 44.9  --   --  41.1  --   --  40.7  --   PLT 237  --   --  217  --   --  207  --   HEPARINUNFRC  --   --   --   --   --   --  0.39 0.30  CREATININE 1.44*  --   --  1.10*  --   --  0.95  --   TROPONINI  --   < > 0.05*  --  0.04* 0.10*  --   --   < > = values in this interval not displayed.  Estimated Creatinine Clearance: 42.1 mL/min (by C-G formula based on Cr of 0.95).   Medications:  Heparin @ 700 units/hr  Assessment: 80 YOF transferred from [facility] after syncopal episode and positive troponins. Also in AFib with RVR. Started on IV heparin. Therapeutic x2 on 700 units/hr, though most recent level on low end of range at 0.3 units/mL.  Hgb 13.2, plts 207. No bleeding noted.  Goal of Therapy:  Heparin level 0.3-0.7 units/ml Monitor platelets by anticoagulation protocol: Yes   Plan:  -increase heparin slightly to 750 units/hr to ensure level stays in range -daily HL and CBC -follow for s/s bleeding -follow for long term AC plans- noted in Dr. Rosezella [place] note today CHADSVASC of 5, but patient with frequent falls  Abad Manard D. Kimie Pidcock, PharmD, BCPS Clinical Pharmacist Pager: [phone] 11/08/2015 10:41 AM

## 2015-11-08 NOTE — Progress Notes (Signed)
ANTICOAGULATION CONSULT NOTE - Follow Up Consult  Pharmacy Consult for heparin Indication: chest pain/ACS and atrial fibrillation   Labs:  Recent Labs  11/06/15 1022  11/07/15 0048 11/07/15 0433 11/07/15 0701 11/07/15 1307 11/08/15 0027  HGB 14.9  --   --  13.3  --   --  13.2  HCT 44.9  --   --  41.1  --   --  40.7  PLT 237  --   --  217  --   --  207  HEPARINUNFRC  --   --   --   --   --   --  0.39  CREATININE 1.44*  --   --  1.10*  --   --   --   TROPONINI  --   < > 0.05*  --  0.04* 0.10*  --   < > = values in this interval not displayed.   Assessment/Plan:  80yo female therapeutic on heparin with initial dosing for CP and Afib. Will continue gtt at current rate and confirm stable with additional level.   Wynona Neat, PharmD, BCPS  11/08/2015,1:06 AM

## 2015-11-08 NOTE — Progress Notes (Signed)
Re-paged md.  Pt wheezing, sob non-productive cough, little anxious hr 130's.   Will continue to monitor Saunders Revel T

## 2015-11-08 NOTE — Progress Notes (Signed)
SUBJECTIVE:  She is confused to place but otherwise offers reasonable offers.     PHYSICAL EXAM Filed Vitals:   11/07/15 2328 11/07/15 2330 11/08/15 0313 11/08/15 0826  BP: 103/77 103/77 141/99 125/88  Pulse: 97   120  Temp: 97.3 F (36.3 C)  97.4 F (36.3 C) 97.2 F (36.2 C)  TempSrc: Oral Oral Oral Oral  Resp: 21 19 22 22   Height:      Weight:      SpO2: 94% 96% 95% 96%   General:  No distress Lungs:  Decreased breath sounds Heart:  Irregular Abdomen:  Positive bowel sounds, no rebound no guarding Extremities:  No edema Neuro:  Nonfocal  LABS: Lab Results  Component Value Date   TROPONINI 0.10* 11/07/2015   Results for orders placed or performed during the hospital encounter of 11/06/15 (from the past 24 hour(s))  Troponin I (q 6hr x 3)     Status: Abnormal   Collection Time: 11/07/15  1:07 PM  Result Value Ref Range   Troponin I 0.10 (H) <0.031 ng/mL  MRSA PCR Screening     Status: None   Collection Time: 11/07/15  6:22 PM  Result Value Ref Range   MRSA by PCR NEGATIVE NEGATIVE  Heparin level (unfractionated)     Status: None   Collection Time: 11/08/15 12:27 AM  Result Value Ref Range   Heparin Unfractionated 0.39 0.30 - 0.70 IU/mL  CBC     Status: None   Collection Time: 11/08/15 12:27 AM  Result Value Ref Range   WBC 9.4 4.0 - 10.5 K/uL   RBC 4.47 3.87 - 5.11 MIL/uL   Hemoglobin 13.2 12.0 - 15.0 g/dL   HCT 40.7 36.0 - 46.0 %   MCV 91.1 78.0 - 100.0 fL   MCH 29.5 26.0 - 34.0 pg   MCHC 32.4 30.0 - 36.0 g/dL   RDW 15.1 11.5 - 15.5 %   Platelets 207 150 - 400 K/uL  Basic metabolic panel     Status: Abnormal   Collection Time: 11/08/15 12:27 AM  Result Value Ref Range   Sodium 136 135 - 145 mmol/L   Potassium 3.6 3.5 - 5.1 mmol/L   Chloride 108 101 - 111 mmol/L   CO2 19 (L) 22 - 32 mmol/L   Glucose, Bld 123 (H) 65 - 99 mg/dL   BUN 21 (H) 6 - 20 mg/dL   Creatinine, Ser 0.95 0.44 - 1.00 mg/dL   Calcium 8.4 (L) 8.9 - 10.3 mg/dL   GFR calc non Af  Amer 56 (L) >60 mL/min   GFR calc Af Amer >60 >60 mL/min   Anion gap 9 5 - 15    Intake/Output Summary (Last 24 hours) at 11/08/15 0904 Last data filed at 11/08/15 0600  Gross per 24 hour  Intake 3156.6 ml  Output    525 ml  Net 2631.6 ml    EKG:   Atrial fib, rapid rate, low voltage, poor anterior R wave progression, diffuse nonspecific T wave changes. 11/08/2015  ASSESSMENT AND PLAN:  ATRIAL FIB:   Rate is not well controlled.  I will increase the frequency of the metoprolol tartrate for now.  Of unknown duration.  Long term anticoagulation will be a significant problem because she reports that she has frequent falls.   However,  [name] has a CHA2DS2 - VASc score of 5 with a risk of stroke of 6.7%.   My impression at this time is that risk of  long term anticoagulation outweighs the benefit until we understand her social situation.   I will also start a low dose of digoxin.   SYNCOPE:    She denies this although her neighbor reports multiple episodes.  Check orthostatic BPs.  She will likely need an out patient event monitor if no etiology is forthcoming.  MR:  Moderate by echo this admission.  Follow clinically.    ELEVATED TROPONIN:  Suspect that this is related to rapid rate.  Continue to trend.  I am not planning an invasive work up at this point.   CARDIOMYOPATHY:  Presumed non ischemic with EF now 20%.  Was 40 - 40% with nonobstructive CAD on cath in 2010.    Titrate meds.  I will start with   PVD:  90% distal aortic narrowing noted by Dr. Claiborne Billings on cath 2010.    DISPOSITION:  I think this will be difficult.  She has no immediate family but sounds like a neighbor helps her a great deal.  We will need to have conversations with her about assisted living.    UTI:  Continue Cipro.  Change to PO.    [name] [facility] 11/08/2015 9:04 AM

## 2015-11-09 ENCOUNTER — Inpatient Hospital Stay (HOSPITAL_COMMUNITY): Payer: Medicare HMO

## 2015-11-09 ENCOUNTER — Encounter (HOSPITAL_COMMUNITY): Payer: Self-pay | Admitting: Student

## 2015-11-09 DIAGNOSIS — I4891 Unspecified atrial fibrillation: Secondary | ICD-10-CM | POA: Insufficient documentation

## 2015-11-09 DIAGNOSIS — G9340 Encephalopathy, unspecified: Secondary | ICD-10-CM

## 2015-11-09 DIAGNOSIS — R109 Unspecified abdominal pain: Secondary | ICD-10-CM

## 2015-11-09 DIAGNOSIS — R400 Somnolence: Secondary | ICD-10-CM

## 2015-11-09 DIAGNOSIS — R5381 Other malaise: Secondary | ICD-10-CM

## 2015-11-09 DIAGNOSIS — R1084 Generalized abdominal pain: Secondary | ICD-10-CM

## 2015-11-09 DIAGNOSIS — N390 Urinary tract infection, site not specified: Secondary | ICD-10-CM

## 2015-11-09 LAB — CBC
HCT: 41.2 % (ref 36.0–46.0)
Hemoglobin: 13.6 g/dL (ref 12.0–15.0)
MCH: 29.7 pg (ref 26.0–34.0)
MCHC: 33 g/dL (ref 30.0–36.0)
MCV: 90 fL (ref 78.0–100.0)
Platelets: 241 K/uL (ref 150–400)
RBC: 4.58 MIL/uL (ref 3.87–5.11)
RDW: 15.2 % (ref 11.5–15.5)
WBC: 8.8 K/uL (ref 4.0–10.5)

## 2015-11-09 LAB — CBC WITH DIFFERENTIAL/PLATELET
Basophils Absolute: 0 10*3/uL (ref 0.0–0.1)
Basophils Relative: 0 %
EOS PCT: 1 %
Eosinophils Absolute: 0 10*3/uL (ref 0.0–0.7)
HEMATOCRIT: 38.7 % (ref 36.0–46.0)
Hemoglobin: 12.5 g/dL (ref 12.0–15.0)
LYMPHS ABS: 1.4 10*3/uL (ref 0.7–4.0)
LYMPHS PCT: 18 %
MCH: 29.2 pg (ref 26.0–34.0)
MCHC: 32.3 g/dL (ref 30.0–36.0)
MCV: 90.4 fL (ref 78.0–100.0)
MONO ABS: 0.5 10*3/uL (ref 0.1–1.0)
MONOS PCT: 7 %
NEUTROS ABS: 5.7 10*3/uL (ref 1.7–7.7)
Neutrophils Relative %: 74 %
PLATELETS: 217 10*3/uL (ref 150–400)
RBC: 4.28 MIL/uL (ref 3.87–5.11)
RDW: 15.1 % (ref 11.5–15.5)
WBC: 7.7 10*3/uL (ref 4.0–10.5)

## 2015-11-09 LAB — LACTIC ACID, PLASMA: LACTIC ACID, VENOUS: 1.5 mmol/L (ref 0.5–2.0)

## 2015-11-09 LAB — URINE CULTURE: Culture: >=100000

## 2015-11-09 LAB — BASIC METABOLIC PANEL
ANION GAP: 10 (ref 5–15)
Anion gap: 9 (ref 5–15)
BUN: 23 mg/dL — AB (ref 6–20)
BUN: 23 mg/dL — AB (ref 6–20)
CHLORIDE: 107 mmol/L (ref 101–111)
CO2: 20 mmol/L — AB (ref 22–32)
CO2: 18 mmol/L — AB (ref 22–32)
CREATININE: 1.02 mg/dL — AB (ref 0.44–1.00)
Calcium: 8.6 mg/dL — ABNORMAL LOW (ref 8.9–10.3)
Calcium: 8.6 mg/dL — ABNORMAL LOW (ref 8.9–10.3)
Chloride: 105 mmol/L (ref 101–111)
Creatinine, Ser: 0.98 mg/dL (ref 0.44–1.00)
GFR calc Af Amer: 59 mL/min — ABNORMAL LOW (ref >60)
GFR calc Af Amer: >60 mL/min (ref >60)
GFR calc non Af Amer: 51 mL/min — ABNORMAL LOW (ref >60)
GFR, EST NON AFRICAN AMERICAN: 54 mL/min — AB (ref >60)
GLUCOSE: 144 mg/dL — AB (ref 65–99)
GLUCOSE: 123 mg/dL — AB (ref 65–99)
POTASSIUM: 3.7 mmol/L (ref 3.5–5.1)
POTASSIUM: 3.6 mmol/L (ref 3.5–5.1)
SODIUM: 134 mmol/L — AB (ref 135–145)
Sodium: 135 mmol/L (ref 135–145)

## 2015-11-09 LAB — HEPARIN LEVEL (UNFRACTIONATED)
Heparin Unfractionated: 0.21 [IU]/mL — ABNORMAL LOW (ref 0.30–0.70)
Heparin Unfractionated: 0.38 IU/mL (ref 0.30–0.70)
Heparin Unfractionated: 0.52 IU/mL (ref 0.30–0.70)

## 2015-11-09 LAB — POCT I-STAT 3, ART BLOOD GAS (G3+)
BICARBONATE: 22.9 meq/L (ref 20.0–24.0)
O2 SAT: 99 %
PCO2 ART: 31.9 mmHg — AB (ref 35.0–45.0)
PO2 ART: 111 mmHg — AB (ref 80.0–100.0)
Patient temperature: 98.2
TCO2: 24 mmol/L (ref 0–100)
pH, Arterial: 7.463 — ABNORMAL HIGH (ref 7.350–7.450)

## 2015-11-09 LAB — BRAIN NATRIURETIC PEPTIDE: B Natriuretic Peptide: 1757.7 pg/mL — ABNORMAL HIGH (ref 0.0–100.0)

## 2015-11-09 LAB — TROPONIN I: Troponin I: 0.1 ng/mL — ABNORMAL HIGH (ref <0.031)

## 2015-11-09 LAB — AMMONIA: Ammonia: 25 umol/L (ref 9–35)

## 2015-11-09 MED ORDER — FUROSEMIDE 10 MG/ML IJ SOLN
40.0000 mg | Freq: Once | INTRAMUSCULAR | Status: AC
  Administered 2015-11-09: 40 mg via INTRAVENOUS
  Filled 2015-11-09: qty 4

## 2015-11-09 MED ORDER — DEXTROSE 5 % IV SOLN
1.0000 g | INTRAVENOUS | Status: DC
  Administered 2015-11-09 – 2015-11-12 (×4): 1 g via INTRAVENOUS
  Filled 2015-11-09 (×6): qty 10

## 2015-11-09 MED ORDER — DILTIAZEM HCL 100 MG IV SOLR
5.0000 mg/h | INTRAVENOUS | Status: DC
  Administered 2015-11-09: 5 mg/h via INTRAVENOUS
  Filled 2015-11-09 (×2): qty 100

## 2015-11-09 MED ORDER — METOPROLOL TARTRATE 1 MG/ML IV SOLN: 5.0000 mg | Freq: Once | INTRAVENOUS | Status: DC

## 2015-11-09 NOTE — Progress Notes (Signed)
Family contacts include: Brother - Rodman Key [phone] Sister Danton Clap [phone]  Linna Darner, MD Family Medicine [facility] 11/09/2015, 2:15 PM

## 2015-11-09 NOTE — Progress Notes (Signed)
Pt. Became extremely lethargic and was unable to take 1000 oral meds. PA notified and blood gas is to be ordered.

## 2015-11-09 NOTE — Progress Notes (Signed)
ANTICOAGULATION CONSULT NOTE - Follow Up Consult  Pharmacy Consult for Heparin Indication: chest pain/ACS and atrial fibrillation  Allergies  Allergen Reactions  . Penicillins     Patient Measurements: Height: 5\' 4"  (162.6 cm) Weight: 131 lb 2.8 oz (59.5 kg) IBW/kg (Calculated) : 54.7 Heparin Dosing Weight: 60 kg  Vital Signs: Temp: 98.2 F (36.8 C) (12/19 1107) Temp Source: Axillary (12/19 1107) BP: 138/74 mmHg (12/19 1107) Pulse Rate: 104 (12/19 0718)  Labs:  Recent Labs  11/07/15 0701 11/07/15 1307  11/08/15 0027 11/08/15 0932 11/09/15 0014 11/09/15 0302 11/09/15 1124  HGB  --   --   --  13.2  --  12.5 13.6  --   HCT  --   --   --  40.7  --  38.7 41.2  --   PLT  --   --   --  207  --  217 241  --   HEPARINUNFRC  --   --   < > 0.39 0.30  --  0.21* 0.38  CREATININE  --   --   --  0.95  --  1.02* 0.98  --   TROPONINI 0.04* 0.10*  --   --   --  0.10*  --   --   < > = values in this interval not displayed.  Estimated Creatinine Clearance: 40.9 mL/min (by C-G formula based on Cr of 0.98).   Medications:  Scheduled:  . aspirin EC  81 mg Oral Daily  . cefTRIAXone (ROCEPHIN)  IV  1 g Intravenous Q24H  . ciprofloxacin  250 mg Oral BID  . digoxin  0.25 mg Intravenous Q6H  . digoxin  0.0625 mg Oral Daily  . metoprolol tartrate  25 mg Oral QID   Infusions:  . sodium chloride 75 mL (11/08/15 0539)  . diltiazem (CARDIZEM) infusion 5 mg/hr (11/09/15 1253)  . heparin 900 Units/hr (11/09/15 0700)   PRN: acetaminophen **OR** acetaminophen, haloperidol lactate, levalbuterol, ondansetron **OR** ondansetron (ZOFRAN) IV, senna-docusate  Assessment: 80 YOF on heparin for positive troponin and afib. HL thereapeutic at 0.38.  Hgb 13.6, plts 241. No bleeding noted  Goal of Therapy:  Heparin level 0.3-0.7 units/ml Monitor platelets by anticoagulation protocol: Yes   Plan:  -Continue heparin gtt at 900 units/hr -8h HL to confirm  -daily HL and CBC -Monitor for s/sx of  bleeding  Darl Kuss E Niyati Heinke 11/09/2015,1:15 PM

## 2015-11-09 NOTE — Progress Notes (Signed)
ANTICOAGULATION CONSULT NOTE - Follow Up Consult  Pharmacy Consult for Heparin Indication: chest pain/ACS and atrial fibrillation  Allergies  Allergen Reactions  . Penicillins     Patient Measurements: Height: 5\' 4"  (162.6 cm) Weight: 131 lb 2.8 oz (59.5 kg) IBW/kg (Calculated) : 54.7 Heparin Dosing Weight: 60 kg  Vital Signs: Temp: 97.2 F (36.2 C) (12/19 1900) Temp Source: Oral (12/19 1900) BP: 173/90 mmHg (12/19 1900) Pulse Rate: 93 (12/19 1800)  Labs:  Recent Labs  11/07/15 0701 11/07/15 1307 11/08/15 0027  11/09/15 0014 11/09/15 0302 11/09/15 1124 11/09/15 1906  HGB  --   --  13.2  --  12.5 13.6  --   --   HCT  --   --  40.7  --  38.7 41.2  --   --   PLT  --   --  207  --  217 241  --   --   HEPARINUNFRC  --   --  0.39  < >  --  0.21* 0.38 0.52  CREATININE  --   --  0.95  --  1.02* 0.98  --   --   TROPONINI 0.04* 0.10*  --   --  0.10*  --   --   --   < > = values in this interval not displayed.  Estimated Creatinine Clearance: 40.9 mL/min (by C-G formula based on Cr of 0.98).   Assessment: 80 YOF on heparin for positive troponin and afib. Repeat HL thereapeutic at 0.52.  Hgb 13.6, plts 241. Pt voided 100 mls of red tinged urine per RN note.  Goal of Therapy:  Heparin level 0.3-0.7 units/ml Monitor platelets by anticoagulation protocol: Yes   Plan:  -Continue heparin gtt at 900 units/hr -daily HL and CBC -Monitor for s/sx of bleeding - f/u red tinged urine  Eudelia Bunch, Pharm.D. BP:[phone] 11/09/2015 8:26 PM

## 2015-11-09 NOTE — Progress Notes (Addendum)
SUBJECTIVE:  She is confused to place and less responsive. She does respond to stimulation but responses are not very intelligible. Complains of abdominal pain.    PHYSICAL EXAM Filed Vitals:   11/09/15 0718 11/09/15 0800 11/09/15 0900 11/09/15 1107  BP: 157/107   138/74  Pulse: 104     Temp: 97.7 F (36.5 C)   98.2 F (36.8 C)  TempSrc: Axillary   Axillary  Resp: 20 21 15 20   Height:      Weight:      SpO2: 98%  100% 98%   General:  Elderly thin WF very somnolent.  Lungs:  Decreased breath sounds- diffusely. Heart:  Irregular, no gallop or murmur.  Abdomen:  Positive bowel sounds, diffuse tenderness to palpation.  Extremities:  No edema Neuro:  Nonfocal  LABS: Lab Results  Component Value Date   TROPONINI 0.10* 11/09/2015   Results for orders placed or performed during the hospital encounter of 11/06/15 (from the past 24 hour(s))  CBC with Differential/Platelet     Status: None   Collection Time: 11/09/15 12:14 AM  Result Value Ref Range   WBC 7.7 4.0 - 10.5 K/uL   RBC 4.28 3.87 - 5.11 MIL/uL   Hemoglobin 12.5 12.0 - 15.0 g/dL   HCT 38.7 36.0 - 46.0 %   MCV 90.4 78.0 - 100.0 fL   MCH 29.2 26.0 - 34.0 pg   MCHC 32.3 30.0 - 36.0 g/dL   RDW 15.1 11.5 - 15.5 %   Platelets 217 150 - 400 K/uL   Neutrophils Relative % 74 %   Neutro Abs 5.7 1.7 - 7.7 K/uL   Lymphocytes Relative 18 %   Lymphs Abs 1.4 0.7 - 4.0 K/uL   Monocytes Relative 7 %   Monocytes Absolute 0.5 0.1 - 1.0 K/uL   Eosinophils Relative 1 %   Eosinophils Absolute 0.0 0.0 - 0.7 K/uL   Basophils Relative 0 %   Basophils Absolute 0.0 0.0 - 0.1 K/uL  Brain natriuretic peptide     Status: Abnormal   Collection Time: 11/09/15 12:14 AM  Result Value Ref Range   B Natriuretic Peptide 1757.7 (H) 0.0 - 100.0 pg/mL  Troponin I     Status: Abnormal   Collection Time: 11/09/15 12:14 AM  Result Value Ref Range   Troponin I 0.10 (H) <0.031 ng/mL  Basic metabolic panel     Status: Abnormal   Collection Time:  11/09/15 12:14 AM  Result Value Ref Range   Sodium 134 (L) 135 - 145 mmol/L   Potassium 3.6 3.5 - 5.1 mmol/L   Chloride 107 101 - 111 mmol/L   CO2 18 (L) 22 - 32 mmol/L   Glucose, Bld 123 (H) 65 - 99 mg/dL   BUN 23 (H) 6 - 20 mg/dL   Creatinine, Ser 1.02 (H) 0.44 - 1.00 mg/dL   Calcium 8.6 (L) 8.9 - 10.3 mg/dL   GFR calc non Af Amer 51 (L) >60 mL/min   GFR calc Af Amer 59 (L) >60 mL/min   Anion gap 9 5 - 15  Heparin level (unfractionated)     Status: Abnormal   Collection Time: 11/09/15  3:02 AM  Result Value Ref Range   Heparin Unfractionated 0.21 (L) 0.30 - 0.70 IU/mL  CBC     Status: None   Collection Time: 11/09/15  3:02 AM  Result Value Ref Range   WBC 8.8 4.0 - 10.5 K/uL   RBC 4.58 3.87 - 5.11 MIL/uL   Hemoglobin  13.6 12.0 - 15.0 g/dL   HCT 41.2 36.0 - 46.0 %   MCV 90.0 78.0 - 100.0 fL   MCH 29.7 26.0 - 34.0 pg   MCHC 33.0 30.0 - 36.0 g/dL   RDW 15.2 11.5 - 15.5 %   Platelets 241 150 - 400 K/uL  Basic metabolic panel     Status: Abnormal   Collection Time: 11/09/15  3:02 AM  Result Value Ref Range   Sodium 135 135 - 145 mmol/L   Potassium 3.7 3.5 - 5.1 mmol/L   Chloride 105 101 - 111 mmol/L   CO2 20 (L) 22 - 32 mmol/L   Glucose, Bld 144 (H) 65 - 99 mg/dL   BUN 23 (H) 6 - 20 mg/dL   Creatinine, Ser 0.98 0.44 - 1.00 mg/dL   Calcium 8.6 (L) 8.9 - 10.3 mg/dL   GFR calc non Af Amer 54 (L) >60 mL/min   GFR calc Af Amer >60 >60 mL/min   Anion gap 10 5 - 15    Intake/Output Summary (Last 24 hours) at 11/09/15 1147 Last data filed at 11/09/15 0900  Gross per 24 hour  Intake 542.51 ml  Output    550 ml  Net  -7.49 ml    EKG:   11/08/15: Atrial fib, rapid rate, low voltage, poor anterior R wave progression, diffuse nonspecific T wave changes.   Telemetry: Atrial fibrillation with RVR last night. Rate control improved this am.   ASSESSMENT AND PLAN:  1. ATRIAL FIB:   Rate control poor. Patient now not able to safely take po meds due to mental status changes.  I  will resume IV diltiazem for rate control until able to take po. Cardizem is not ideal with poor EF but little options for now. on able to take po can resume metoprolol and dig.   Afib is of unknown duration.  Long term anticoagulation will be a significant problem because she reports that she has frequent falls.   However,  Ms. [name] has a CHA2DS2 - VASc score of 5 with a risk of stroke of 6.7%.   My impression at this time is that risk of long term anticoagulation outweighs the benefit until we understand her social situation.     2. SYNCOPE:    She denies this although her neighbor reports multiple episodes.  Check orthostatic BPs.  She will likely need an out patient event monitor if no etiology is forthcoming.  3. MR:  Moderate by echo this admission.  Follow clinically.    4. ELEVATED TROPONIN:  Suspect that this is related to rapid rate with demand ischemia.  Mild elevation with flat trend.   I am not planning an invasive work up at this point.   5. CARDIOMYOPATHY:  Presumed non ischemic with EF now 20%.  Was 8 - 40% with nonobstructive CAD on cath in 2010.  Once able to take po will titrate meds.  6. PVD:  90% distal aortic narrowing noted by Dr. Claiborne Billings on cath 2010.    7. UTI. Klebsiella sensitive to Cipro. Completed 3 day course.   8. Mental status changes. Negative CT head on 12/16. No focal findings. On no sedatives. Will check ABG  9. Abdominal pain.   DISPOSITION:  I think this will be difficult.  She has no immediate family but sounds like a neighbor helps her a great deal.  We will need to have conversations with her about assisted living versus Rehab facility.  Will ask [facility] to assist with her care given her multiple and complex medical problems.     Collier [facility]ina [facility] 11/09/2015 11:47 AM

## 2015-11-09 NOTE — Consult Note (Signed)
[facility] Medical Consultation  [name] [id number] DOB: [birth year] DOA: 11/06/2015 PCP: Purvis Kilts, MD   Requesting physician: Dr Martinique - Cardiology Date of consultation: 11/09/15 Reason for consultation: AMS  Impression/Recommendations Principal Problem:   Syncope Active Problems:   Dehydration   Atrial fibrillation with RVR ([place])   UTI (lower urinary tract infection)   ARF (acute renal failure) ([facility])   Leukocytosis   VT (ventricular tachycardia) ([place])   Acute encephalopathy   Physical deconditioning   AMS: Suspect infectious etiology including UTI, GI, HCAP. Unlikely polypharmacy or psychiatric etiology. Culture sensitivities show Hughes Better that is sensitive to current regimen of Cipro but pts only complaint at this time is "bladder pain." WBC 8.8 (stable), ABG pH 7.46, PCO2 31.9, PO2 111, bicarbonate 22.9. AF VSS. Unable to fully assess pts mental status as she continues to request that I leave her alone. Small loose stool x1 this am per nurse. - Lactic acid - repeat UA - CXR, KUB - bladder scan - Cdiff (on cipro) - Stop Cipro and start Ceftriaxone - Consider repeat CT head if not improving.   Afib/RVR: per primary team. Pt placed back on Diltiazem drip due to decreased PO. CHA2DS2-VASc score 5. H/o CAD (non-obstructive 35-40% per last cath 2010), and MR. Cards trending elevated troponin which is likely from demenad ischemia.  - Per Cards  Syncope: pt initially admitted for syncope. Suspect multifactorial including physical deconditioning (pt lives at home by self), cardiac arrhythmia, and dehydration.  - f/u orthostatic BPs (ordered by cards) - PT/OT - CSW (anticipate DC to SNF vs in home healthcare).   Systolic CHF: Recent Dx. Non-ischemic. No evidence of acute decompensation at this time. EF 20%. - Per Cards    Chief Complaint: AMS  HPI:  [facility] group called to consult on patient by cardiology due to patient's change in  mental status this morning. Of note patient was admitted on 11/06/2015 due to fall and syncope and was transferred to cardiology service due to A. fib with RVR. Patient had been progressing until this morning when she was noted to be very somnolent. Per report by nursing staff patient had 1 small watery stool this morning. Patient has also been on treatment for UTI since admission and was treated with Cipro. Oral medications have been stopped this morning and transfer to IV due to decreased ability to take orals. Symptoms are constant and do not appear to be getting that are worse necessarily. Nothing makes her symptoms better. Recent changes in medications as per hospital records. Pts only complaint at this time is that her "Bladder hurts."   Review of Systems:  Unable to obtain further ROS due to pts mental status  Past Medical History  Diagnosis Date  . Arthritis   . CAD (coronary artery disease)     Nonobstructive by cath in 2010  . New onset atrial fibrillation ([place]) 10/2015  . Nonischemic cardiomyopathy ([place])     a. EF 35-40% in 2010 b. 20-25% in 10/2015   Past Surgical History  Procedure Laterality Date  . Total hip arthroplasty  2011   Social History:  reports that she has been smoking Cigarettes.  She does not have any smokeless tobacco history on file. She reports that she does not drink alcohol or use illicit drugs.  Allergies  Allergen Reactions  . Penicillins    Family History  Problem Relation Age of Onset  . Family history unknown: Yes    Unable to obtain family history due  to pts mental status and family members being unavaible for consultation. Attempted to reach brother, Rodman Key and sister Danton Clap, by phone on multiple occasions.   Prior to Admission medications   Medication Sig Start Date End Date Taking? Authorizing Provider  ciprofloxacin (CIPRO) 500 MG tablet Take 500 mg by mouth 2 (two) times daily. 11/05/15  Yes Historical Provider, MD  naproxen sodium  (ANAPROX) 220 MG tablet Take 220 mg by mouth 2 (two) times daily with a meal.   Yes Historical Provider, MD  aspirin EC 325 MG tablet Take 1 tablet (325 mg total) by mouth daily. Patient not taking: Reported on 11/06/2015 06/19/13   Varney Biles, MD   Physical Exam: Blood pressure 138/74, pulse 104, temperature 98.2 F (36.8 C), temperature source Axillary, resp. rate 16, height 5\' 4"  (1.626 m), weight 59.5 kg (131 lb 2.8 oz), SpO2 100 %. Filed Vitals:   11/09/15 1107 11/09/15 1200  BP: 138/74   Pulse:    Temp: 98.2 F (36.8 C)   Resp: 20 16     General:  Elderly, cachectic  Eyes: EOMI, lids nml  ENT: dry mm  Neck: no thyromegaly, no cervical lymphadenopathy  Cardiovascular: irregular, no m/r/g, no LE edema  Respiratory: few ronchi in bases bilat. nml effort. No wheezes  Abdomen: suprapubic ttp, NABS  Skin: no rash, no tears  Musculoskeletal: nml symmetrical tone, no effusions  Psychiatric: somnolent but arouses w/ stimuli, answers some questions appropriately but unable to discuss anything other than her pain  Neurologic: moves all extremities in coordinated fashion, CN2-12 grossly intact.   Labs on Admission:  Basic Metabolic Panel:  Recent Labs Lab 11/06/15 1022 11/07/15 0433 11/08/15 0027 11/09/15 0014 11/09/15 0302  NA 136 138 136 134* 135  K 4.5 3.6 3.6 3.6 3.7  CL 101 108 108 107 105  CO2 22 20* 19* 18* 20*  GLUCOSE 104* 97 123* 123* 144*  BUN 44* 33* 21* 23* 23*  CREATININE 1.44* 1.10* 0.95 1.02* 0.98  CALCIUM 9.5 8.4* 8.4* 8.6* 8.6*   Liver Function Tests: No results for input(s): AST, ALT, ALKPHOS, BILITOT, PROT, ALBUMIN in the last 168 hours. No results for input(s): LIPASE, AMYLASE in the last 168 hours. No results for input(s): AMMONIA in the last 168 hours. CBC:  Recent Labs Lab 11/06/15 1022 11/07/15 0433 11/08/15 0027 11/09/15 0014 11/09/15 0302  WBC 12.9* 9.7 9.4 7.7 8.8  NEUTROABS  --   --   --  5.7  --   HGB 14.9 13.3 13.2  12.5 13.6  HCT 44.9 41.1 40.7 38.7 41.2  MCV 90.5 90.5 91.1 90.4 90.0  PLT 237 217 207 217 241   Cardiac Enzymes:  Recent Labs Lab 11/06/15 1932 11/07/15 0048 11/07/15 0701 11/07/15 1307 11/09/15 0014  TROPONINI 0.05* 0.05* 0.04* 0.10* 0.10*   BNP: Invalid input(s): POCBNP CBG:  Recent Labs Lab 11/06/15 1022  GLUCAP 88    Radiological Exams on Admission: Dg Chest Port 1 View  11/09/2015  CLINICAL DATA:  80 year old female with shortness of breath EXAM: PORTABLE CHEST 1 VIEW COMPARISON:  Chest radiograph dated 11/06/2015 FINDINGS: Single-view of the chest demonstrate bibasilar airspace opacities, progressed compared to the prior study. There are small bilateral pleural effusions. There is stable cardiomegaly. No pneumothorax. The osseous structures are grossly unremarkable. IMPRESSION: Interval development of Small bilateral pleural effusions with bibasilar atelectatic changes versus pneumonia. Clinical correlation and follow-up recommended. Electronically Signed   By: Anner Crete M.D.   On: 11/09/2015 00:10  Phyllistine Domingos J [facility]   If 7PM-7AM, please contact night-coverage [email or web address] Password [email or web address] 11/09/2015, 1:21 PM

## 2015-11-09 NOTE — Care Management Important Message (Signed)
Important Message  Patient Details  Name: [name] MRN: [id number] Date of Birth: 1935/06/23   Medicare Important Message Given:  Yes    Avani Sensabaugh P Matilda Fleig 11/09/2015, 3:02 PM

## 2015-11-09 NOTE — Clinical Documentation Improvement (Signed)
Cardiology, Internal Medicine  Can the diagnosis of CHF be further specified documented in 12/17 H&P? Please document findings in next progress note; not in BPA drop down box. Thank you!    Acuity - Acute, Chronic, Acute on Chronic   Type - Systolic, Diastolic, Systolic and Diastolic  Other  Clinically Undetermined  Document any associated diagnoses/conditions  Supporting Information:  BNP is 1758  Treated with IV Lasix 40mg  once and PO Lopressor 25mg  2 times daily  ECHO reveals EF of 20 - [id number] with systolic function severely reduced  Please exercise your independent, professional judgment when responding. A specific answer is not anticipated or expected.  Thank You,  Zoila Shutter RN, BSN, [place] [phone]

## 2015-11-09 NOTE — Progress Notes (Signed)
Pt. Voided approxed 156ml of red tinged urine. Bladder scanned afterwards and there was and excess of 567mL post void residual.  Will notify MD and continue to monitor.

## 2015-11-09 NOTE — Progress Notes (Signed)
ANTICOAGULATION CONSULT NOTE - Follow Up Consult  Pharmacy Consult for heparin Indication: chest pain/ACS and atrial fibrillation  Allergies  Allergen Reactions  . Penicillins     Patient Measurements: Height: 5\' 4"  (162.6 cm) Weight: 131 lb 2.8 oz (59.5 kg) IBW/kg (Calculated) : 54.7 Heparin Dosing Weight: 60kg  Vital Signs: Temp: 97.5 F (36.4 C) (12/18 2326) Temp Source: Axillary (12/18 2326) BP: 133/122 mmHg (12/19 0300) Pulse Rate: 123 (12/18 2112)  Labs:  Recent Labs  11/07/15 0433 11/07/15 0701 11/07/15 1307 11/08/15 0027 11/08/15 0932 11/09/15 0014 11/09/15 0302  HGB 13.3  --   --  13.2  --  12.5 13.6  HCT 41.1  --   --  40.7  --  38.7 41.2  PLT 217  --   --  207  --  217 241  HEPARINUNFRC  --   --   --  0.39 0.30  --  0.21*  CREATININE 1.10*  --   --  0.95  --  1.02*  --   TROPONINI  --  0.04* 0.10*  --   --  0.10*  --     Estimated Creatinine Clearance: 39.3 mL/min (by C-G formula based on Cr of 1.02).   Medications:  Heparin @ 750 units/hr  Assessment: 80 YOF on heparin for positive troponin and afib. Heparin level subtherapeutic (0.21). CBC stable. No issues with line or bleeding reported per RN.  Goal of Therapy:  Heparin level 0.3-0.7 units/ml Monitor platelets by anticoagulation protocol: Yes   Plan:  -Increase heparin to 900 units/hr -F/u 8 hour heparin level  Sherlon Handing, PharmD, BCPS Clinical pharmacist, pager [phone] 11/09/2015 3:49 AM

## 2015-11-10 ENCOUNTER — Inpatient Hospital Stay (HOSPITAL_COMMUNITY): Payer: Medicare HMO

## 2015-11-10 DIAGNOSIS — L8990 Pressure ulcer of unspecified site, unspecified stage: Secondary | ICD-10-CM | POA: Insufficient documentation

## 2015-11-10 DIAGNOSIS — G9340 Encephalopathy, unspecified: Secondary | ICD-10-CM

## 2015-11-10 DIAGNOSIS — I5022 Chronic systolic (congestive) heart failure: Secondary | ICD-10-CM | POA: Insufficient documentation

## 2015-11-10 LAB — URINALYSIS, ROUTINE W REFLEX MICROSCOPIC
BILIRUBIN URINE: NEGATIVE
GLUCOSE, UA: NEGATIVE mg/dL
KETONES UR: 40 mg/dL — AB
Nitrite: NEGATIVE
PROTEIN: 100 mg/dL — AB
Specific Gravity, Urine: 1.011 (ref 1.005–1.030)
pH: 7 (ref 5.0–8.0)

## 2015-11-10 LAB — C DIFFICILE QUICK SCREEN W PCR REFLEX
C DIFFICILE (CDIFF) INTERP: NEGATIVE
C DIFFICILE (CDIFF) TOXIN: NEGATIVE
C DIFFICLE (CDIFF) ANTIGEN: NEGATIVE

## 2015-11-10 LAB — HEPATIC FUNCTION PANEL
ALK PHOS: 72 U/L (ref 38–126)
ALT: 18 U/L (ref 14–54)
AST: 21 U/L (ref 15–41)
Albumin: 2.8 g/dL — ABNORMAL LOW (ref 3.5–5.0)
BILIRUBIN DIRECT: 0.3 mg/dL (ref 0.1–0.5)
BILIRUBIN INDIRECT: 1 mg/dL — AB (ref 0.3–0.9)
BILIRUBIN TOTAL: 1.3 mg/dL — AB (ref 0.3–1.2)
TOTAL PROTEIN: 5 g/dL — AB (ref 6.5–8.1)

## 2015-11-10 LAB — CBC
HCT: 43 % (ref 36.0–46.0)
HEMOGLOBIN: 14.6 g/dL (ref 12.0–15.0)
MCH: 29.8 pg (ref 26.0–34.0)
MCHC: 34 g/dL (ref 30.0–36.0)
MCV: 87.8 fL (ref 78.0–100.0)
PLATELETS: 172 10*3/uL (ref 150–400)
RBC: 4.9 MIL/uL (ref 3.87–5.11)
RDW: 14.9 % (ref 11.5–15.5)
WBC: 10.1 10*3/uL (ref 4.0–10.5)

## 2015-11-10 LAB — URINE MICROSCOPIC-ADD ON

## 2015-11-10 LAB — HEPARIN LEVEL (UNFRACTIONATED): Heparin Unfractionated: 0.35 IU/mL (ref 0.30–0.70)

## 2015-11-10 MED ORDER — OFF THE BEAT BOOK
Freq: Once | Status: AC
  Administered 2015-11-10: 12:00:00
  Filled 2015-11-10: qty 1

## 2015-11-10 MED ORDER — HEPARIN SODIUM (PORCINE) 5000 UNIT/ML IJ SOLN
5000.0000 [IU] | Freq: Three times a day (TID) | INTRAMUSCULAR | Status: DC
  Administered 2015-11-10 – 2015-11-13 (×7): 5000 [IU] via SUBCUTANEOUS
  Filled 2015-11-10 (×5): qty 1

## 2015-11-10 NOTE — Progress Notes (Signed)
[facility] Medical Consultation Progress note  [name] [id number] DOB: Nov 18, 1935 DOA: 11/06/2015 PCP: Purvis Kilts, MD   Requesting physician: Dr Martinique - Cardiology Date of consultation: 11/09/15 Reason for consultation: AMS  HPI:  [facility] called to consult on patient by cardiology due to patient's change in mental status this morning. Of note patient was admitted on 11/06/2015 due to fall and syncope and was transferred to cardiology service due to A. fib with RVR. Patient had been progressing until this morning when she was noted to be very somnolent. Per report by nursing staff patient had 1 small watery stool this morning. Patient has also been on treatment for UTI since admission and was treated with Cipro. Oral medications have been stopped this morning and transfer to IV due to decreased ability to take orals. Symptoms are constant and do not appear to be getting that are worse necessarily. Nothing makes her symptoms better. Recent changes in medications as per hospital records. Pts only complaint at this time is that her "Bladder hurts."   Subjective - complains of back pain, no abdominal pain, no nausea/vomiting - alert to person, time place  Impression/Recommendations Principal Problem:   Syncope Active Problems:   Dehydration   Atrial fibrillation with RVR (HCC)   UTI (lower urinary tract infection)   ARF (acute renal failure) (HCC)   Leukocytosis   VT (ventricular tachycardia) (HCC)   Acute encephalopathy   Physical deconditioning   Abdominal pain   Atrial fibrillation with rapid ventricular response (HCC)   Acute encephalopathy - possible infectious etiology as she seems to have improved on Ceftriaxone - Unlikely polypharmacy or psychiatric etiology. - continue Ceftriaxone while here, narrow to Cipro on d/c for total of 7 days  - history of ETOH abuse, quit many years ago. Ammonia normal. - TSH normal - obtain  LFTs  Back pain - per patient this is main complaint and become drowsy due to it? - obtain plain XR   Afib/RVR - per primary team. Pt placed back on Diltiazem drip due to decreased PO. CHA2DS2-VASc score 5. H/o CAD (non-obstructive 35-40% per last cath 2010), and MR. Cards trending elevated troponin which is likely from demenad ischemia.    Syncope - pt initially admitted for syncope. Suspect multifactorial including physical deconditioning (pt lives at home by self), cardiac arrhythmia, and dehydration.   Acute on chronic systolic CHF - Recent Dx. Non-ischemic. No evidence of acute decompensation at this time. EF 20%. - Per Cards   Physical Exam: Blood pressure 163/86, pulse 87, temperature 97.3 F (36.3 C), temperature source Oral, resp. rate 22, height 5\' 4"  (1.626 m), weight 55.792 kg (123 lb), SpO2 98 %. Filed Vitals:   11/10/15 0400 11/10/15 0600  BP: 144/99 163/86  Pulse: 77 87  Temp:    Resp: 28 22     General:  Elderly, cachectic  Eyes: EOMI, lids nml  ENT: dry mm  Neck: no thyromegaly, no cervical lymphadenopathy  Cardiovascular: irregular, no m/r/g, no LE edema  Respiratory: few ronchi in bases bilat. nml effort. No wheezes  Abdomen: suprapubic ttp, NABS  Skin: no rash, no tears  Musculoskeletal: nml symmetrical tone  Neurologic: moves all extremities in coordinated fashion, CN2-12 grossly intact.   Labs on Admission:  Basic Metabolic Panel:  Recent Labs Lab 11/06/15 1022 11/07/15 0433 11/08/15 0027 11/09/15 0014 11/09/15 0302  NA 136 138 136 134* 135  K 4.5 3.6 3.6 3.6 3.7  CL 101 108 108 107 105  CO2 22 20* 19* 18* 20*  GLUCOSE 104* 97 123* 123* 144*  BUN 44* 33* 21* 23* 23*  CREATININE 1.44* 1.10* 0.95 1.02* 0.98  CALCIUM 9.5 8.4* 8.4* 8.6* 8.6*    Recent Labs Lab 11/09/15 1530  AMMONIA 25   CBC:  Recent Labs Lab 11/07/15 0433 11/08/15 0027 11/09/15 0014 11/09/15 0302 11/10/15 0258  WBC 9.7 9.4 7.7 8.8 10.1  NEUTROABS   --   --  5.7  --   --   HGB 13.3 13.2 12.5 13.6 14.6  HCT 41.1 40.7 38.7 41.2 43.0  MCV 90.5 91.1 90.4 90.0 87.8  PLT 217 207 217 241 172   Cardiac Enzymes:  Recent Labs Lab 11/06/15 1932 11/07/15 0048 11/07/15 0701 11/07/15 1307 11/09/15 0014  TROPONINI 0.05* 0.05* 0.04* 0.10* 0.10*   CBG:  Recent Labs Lab 11/06/15 1022  GLUCAP 88    Radiological Exams on Admission: Dg Chest Port 1 View  11/09/2015  CLINICAL DATA:  Respiratory distress EXAM: PORTABLE CHEST 1 VIEW COMPARISON:  Chest radiograph from one day prior. FINDINGS: Stable cardiomediastinal silhouette with mild cardiomegaly. No pneumothorax. Small bilateral pleural effusions not appreciably changed. Mild pulmonary edema, slightly worsened. Bibasilar atelectasis is stable. IMPRESSION: 1. Mild congestive heart failure, slightly worsened. 2. Stable small bilateral pleural effusions with bibasilar atelectasis. Electronically Signed   By: Ilona Sorrel M.D.   On: 11/09/2015 14:30   Dg Chest Port 1 View  11/09/2015  CLINICAL DATA:  80 year old female with shortness of breath EXAM: PORTABLE CHEST 1 VIEW COMPARISON:  Chest radiograph dated 11/06/2015 FINDINGS: Single-view of the chest demonstrate bibasilar airspace opacities, progressed compared to the prior study. There are small bilateral pleural effusions. There is stable cardiomegaly. No pneumothorax. The osseous structures are grossly unremarkable. IMPRESSION: Interval development of Small bilateral pleural effusions with bibasilar atelectatic changes versus pneumonia. Clinical correlation and follow-up recommended. Electronically Signed   By: Anner Crete M.D.   On: 11/09/2015 00:10   Dg Abd Portable 1v  11/09/2015  CLINICAL DATA:  Abdominal pain EXAM: PORTABLE ABDOMEN - 1 VIEW COMPARISON:  12/04/2014 FINDINGS: Nonobstructive bowel gas pattern. No free air organomegaly. Calcifications project over the kidneys bilaterally. These could be vascular or related to renal  stones. Prior right hip replacement.  No acute bony abnormality. IMPRESSION: Possible bilateral renal stone versus vascular calcifications. No evidence of bowel obstruction. Electronically Signed   By: Rolm Baptise M.D.   On: 11/09/2015 14:28     Marzetta Board, MD [facility] [phone]   If 7PM-7AM, please contact night-coverage [email or web address] Password [facility] 11/10/2015, 7:16 AM

## 2015-11-10 NOTE — Progress Notes (Addendum)
SUBJECTIVE:  Patient is much more responsive today. Oriented x 3. Denies any chest pain or SOB.    PHYSICAL EXAM Filed Vitals:   11/10/15 0800 11/10/15 1000 11/10/15 1100 11/10/15 1149  BP: 157/74 157/73 103/60 145/52  Pulse: 76 71 66 45  Temp:    97.7 F (36.5 C)  TempSrc:    Oral  Resp: 19 18 20 19   Height:      Weight:      SpO2: 100% 96% 93% 99%   General:  Elderly thin WF in NAD Lungs:  Decreased breath sounds- diffusely. No rales or rhonchi. Heart:  Irregular, no gallop or murmur.  Abdomen:  Positive bowel sounds, nontender.  Extremities:  No edema Neuro:  Nonfocal  LABS: Lab Results  Component Value Date   TROPONINI 0.10* 11/09/2015   Results for orders placed or performed during the hospital encounter of 11/06/15 (from the past 24 hour(s))  Lactic acid, plasma     Status: None   Collection Time: 11/09/15  3:30 PM  Result Value Ref Range   Lactic Acid, Venous 1.5 0.5 - 2.0 mmol/L  Ammonia     Status: None   Collection Time: 11/09/15  3:30 PM  Result Value Ref Range   Ammonia 25 9 - 35 umol/L  Culture, blood (routine x 2)     Status: None (Preliminary result)   Collection Time: 11/09/15  3:30 PM  Result Value Ref Range   Specimen Description BLOOD LEFT HAND    Special Requests IN PEDIATRIC BOTTLE 1CC    Culture NO GROWTH < 24 HOURS    Report Status PENDING   Culture, blood (routine x 2)     Status: None (Preliminary result)   Collection Time: 11/09/15  3:40 PM  Result Value Ref Range   Specimen Description BLOOD LEFT HAND    Special Requests IN PEDIATRIC BOTTLE 1.5CC    Culture NO GROWTH < 24 HOURS    Report Status PENDING   Heparin level (unfractionated)     Status: None   Collection Time: 11/09/15  7:06 PM  Result Value Ref Range   Heparin Unfractionated 0.52 0.30 - 0.70 IU/mL  C difficile quick scan w PCR reflex     Status: None   Collection Time: 11/09/15  8:32 PM  Result Value Ref Range   C Diff antigen NEGATIVE NEGATIVE   C Diff toxin  NEGATIVE NEGATIVE   C Diff interpretation Negative for toxigenic C. difficile   Heparin level (unfractionated)     Status: None   Collection Time: 11/10/15  2:58 AM  Result Value Ref Range   Heparin Unfractionated 0.35 0.30 - 0.70 IU/mL  CBC     Status: None   Collection Time: 11/10/15  2:58 AM  Result Value Ref Range   WBC 10.1 4.0 - 10.5 K/uL   RBC 4.90 3.87 - 5.11 MIL/uL   Hemoglobin 14.6 12.0 - 15.0 g/dL   HCT 43.0 36.0 - 46.0 %   MCV 87.8 78.0 - 100.0 fL   MCH 29.8 26.0 - 34.0 pg   MCHC 34.0 30.0 - 36.0 g/dL   RDW 14.9 11.5 - 15.5 %   Platelets 172 150 - 400 K/uL  Urinalysis, Routine w reflex microscopic (not at [facility])     Status: Abnormal   Collection Time: 11/10/15  6:24 AM  Result Value Ref Range   Color, Urine RED (A) YELLOW   APPearance CLOUDY (A) CLEAR   Specific Gravity, Urine 1.011 1.005 - 1.030  pH 7.0 5.0 - 8.0   Glucose, UA NEGATIVE NEGATIVE mg/dL   Hgb urine dipstick LARGE (A) NEGATIVE   Bilirubin Urine NEGATIVE NEGATIVE   Ketones, ur 40 (A) NEGATIVE mg/dL   Protein, ur 100 (A) NEGATIVE mg/dL   Nitrite NEGATIVE NEGATIVE   Leukocytes, UA SMALL (A) NEGATIVE  Urine microscopic-add on     Status: Abnormal   Collection Time: 11/10/15  6:24 AM  Result Value Ref Range   Squamous Epithelial / LPF 0-5 (A) NONE SEEN   WBC, UA 0-5 0 - 5 WBC/hpf   RBC / HPF TOO NUMEROUS TO COUNT 0 - 5 RBC/hpf   Bacteria, UA RARE (A) NONE SEEN  Hepatic function panel     Status: Abnormal   Collection Time: 11/10/15  8:20 AM  Result Value Ref Range   Total Protein 5.0 (L) 6.5 - 8.1 g/dL   Albumin 2.8 (L) 3.5 - 5.0 g/dL   AST 21 15 - 41 U/L   ALT 18 14 - 54 U/L   Alkaline Phosphatase 72 38 - 126 U/L   Total Bilirubin 1.3 (H) 0.3 - 1.2 mg/dL   Bilirubin, Direct 0.3 0.1 - 0.5 mg/dL   Indirect Bilirubin 1.0 (H) 0.3 - 0.9 mg/dL    Intake/Output Summary (Last 24 hours) at 11/10/15 1412 Last data filed at 11/10/15 1100  Gross per 24 hour  Intake   1677 ml  Output     10 ml  Net    1667 ml    EKG:   11/08/15: Atrial fib, rapid rate, low voltage, poor anterior R wave progression, diffuse nonspecific T wave changes.   Telemetry: Atrial fibrillation with controlled response on IV cardizem.  ASSESSMENT AND PLAN:  1. ATRIAL FIB:   Rate control improved.  Patient now able to safely take po meds with improved mental status. Will plan to transition to po metoprolol and dig.   Afib is of unknown duration.  Long term anticoagulation will be a significant problem because she reports that she has frequent falls. Reports at least 10 falls in last 6 months.   Ms. PAMMY HELLER has a CHA2DS2 - VASc score of 5 with a risk of stroke of 6.7%.   She also has poor social support living alone. Will hold off on anticoagulation until we can assure the proper environment for safety. In addition she was noted to have bright red blood per rectum today by nursing. Will stop IV heparin and observe.  2. SYNCOPE:    She denies this although her neighbor reports multiple episodes.  She will likely need an out patient event monitor if no etiology is forthcoming.  3. MR:  Moderate by echo this admission.  Follow clinically.    4. ELEVATED TROPONIN:  Suspect that this is related to rapid rate with demand ischemia.  Mild elevation with flat trend.   I am not planning an invasive work up at this point.   5. Chronic systolic CHF with nonischemic cardiomyopathy:  Presumed non ischemic with EF now 20%.  Was 44 - 40% with nonobstructive CAD on cath in 2010.  Will titrate meds as tolerated.  6. PVD:  90% distal aortic narrowing noted by Dr. Claiborne Billings on cath 2010.    7. UTI. Klebsiella improving on antibiotics.  8. Mental status changes. Negative CT head on 12/16. Improved with treatment of infection. Ammonia level and ABG OK.    DISPOSITION:  I think this will be difficult.  She has no immediate family but  sounds like a neighbor helps her a great deal.  We will need to have conversations with her about  assisted living versus Rehab facility.  Appreciate  [facility] assistance with her care given her multiple and complex medical problems.     Collier Salina [facility] 11/10/2015 2:12 PM

## 2015-11-10 NOTE — Evaluation (Signed)
Physical Therapy Evaluation Patient Details Name: [name] MRN: [id number] DOB: 01-09-1935 Today's Date: 11/10/2015   History of Present Illness  Patient is a 80 year old woman without medical history who presents to the hospital today after a syncopal episode. She states she awoke this morning to go to the bathroom and fell to the floor. No one was around hence she does not know how long she was unconscious, she does relate hitting her head. Upon arrival to the emergency department she is found to have a UTI, sound to be in atrial fibrillation with rapid ventricular response with rates in the 120s to 130s which is new onset for her, she was also found to have leukocytosis and acute renal failure and AMS  Clinical Impression  Patient presents with decreased mobility due to deficits listed in PT problem list below.  She will benefit from skilled PT in the acute setting to address deficits and allow return home following SNF level rehab stay.    Follow Up Recommendations SNF;Supervision/Assistance - 24 hour    Equipment Recommendations  Rolling walker with 5" wheels    Recommendations for Other Services       Precautions / Restrictions Precautions Precautions: Fall Restrictions Weight Bearing Restrictions: No      Mobility  Bed Mobility Overal bed mobility: Needs Assistance Bed Mobility: Rolling;Sidelying to Sit Rolling: Min assist Sidelying to sit: Mod assist;Max assist Supine to sit: Max assist     General bed mobility comments: cues for technique due to pain with attempting sitting straight up; guided legs off bed then lifted trunk with cues for pt to push up with rail  Transfers Overall transfer level: Needs assistance   Transfers: Sit to/from Stand;Squat Pivot Transfers Sit to Stand: Max assist   Squat pivot transfers: Max assist     General transfer comment: pt remains flexed in standing while OT assisting with hygiene, cues for increased upright posture and  facilitation through lower trunk and pt able to straighten up some, pivot to [facility] mod/max A  Ambulation/Gait             General Gait Details: unable at this time  Stairs            Wheelchair Mobility    Modified Rankin (Stroke Patients Only)       Balance Overall balance assessment: Needs assistance Sitting-balance support: Bilateral upper extremity supported;Feet unsupported Sitting balance-Leahy Scale: Poor Sitting balance - Comments: varied between min A to max A   Standing balance support: Bilateral upper extremity supported Standing balance-Leahy Scale: Zero                               Pertinent Vitals/Pain Pain Assessment: Faces Faces Pain Scale: Hurts little more Pain Location: upper body pain Pain Intervention(s): Monitored during session;Repositioned    Home Living Family/patient expects to be discharged to:: Skilled nursing facility Living Arrangements: Alone Available Help at Discharge: Friend(s);Available PRN/intermittently Type of Home: House Home Access: Stairs to enter   [employer] of Steps: 2 at back Home Layout: One level Home Equipment: Environmental consultant - 2 wheels      Prior Function Level of Independence: Independent with assistive device(s)         Comments: usually ambulated with a walker     Hand Dominance   Dominant Hand: Right    Extremity/Trunk Assessment   Upper Extremity Assessment: Defer to OT evaluation  Lower Extremity Assessment: Generalized weakness      Cervical / Trunk Assessment: Kyphotic  Communication   Communication: No difficulties  Cognition Arousal/Alertness: Awake/alert Behavior During Therapy: Flat affect Overall Cognitive Status: No family/caregiver present to determine baseline cognitive functioning                 General Comments: Pt oriented to month, year, and upcoming holiday while at the same time she feels she could go home and take care of  herself (even though it took one person to help her stand and another to help her wipe herself post urinating)    General Comments General comments (skin integrity, edema, etc.): pt incontinent in bed of urine, and asked to use BSC, noted bloody urine in [facility], RN aware    Exercises        Assessment/Plan    PT Assessment Patient needs continued PT services  PT Diagnosis Difficulty walking;Generalized weakness   PT Problem List Decreased strength;Decreased activity tolerance;Decreased balance;Decreased mobility;Decreased safety awareness;Decreased cognition;Decreased knowledge of use of DME  PT Treatment Interventions DME instruction;Gait training;Functional mobility training;Patient/family education;Balance training;Therapeutic activities;Therapeutic exercise   PT Goals (Current goals can be found in the Care Plan section) Acute Rehab PT Goals Patient Stated Goal: to go home PT Goal Formulation: With patient Time For Goal Achievement: 11/24/15 Potential to Achieve Goals: Good    Frequency Min 3X/week   Barriers to discharge Decreased caregiver support      Co-evaluation PT/OT/SLP Co-Evaluation/Treatment: Yes Reason for Co-Treatment: For patient/therapist safety PT goals addressed during session: Mobility/safety with mobility;Balance;Strengthening/ROM OT goals addressed during session: ADL's and self-care;Strengthening/ROM       End of Session Equipment Utilized During Treatment: Gait belt Activity Tolerance: Patient limited by fatigue Patient left: in chair;with call bell/phone within reach;with chair alarm set Nurse Communication: Mobility status;Other (comment) (blood in urine, lower BP sitting up)         Time: [id number] PT Time Calculation (min) (ACUTE ONLY): 27 min   Charges:   PT Evaluation $Initial PT Evaluation Tier I: 1 Procedure     PT G CodesReginia Naas 11-16-2015, 12:44 PM Magda Kiel, [place] 16-Nov-2015

## 2015-11-10 NOTE — Progress Notes (Signed)
ANTICOAGULATION CONSULT NOTE - Follow Up Consult  Pharmacy Consult for Heparin Indication: chest pain/ACS and atrial fibrillation  Allergies  Allergen Reactions  . Penicillins     Patient Measurements: Height: 5\' 4"  (162.6 cm) Weight: 123 lb (55.792 kg) IBW/kg (Calculated) : 54.7 Heparin Dosing Weight: 60 kg  Vital Signs: Temp: 98.4 F (36.9 C) (12/20 0741) Temp Source: Axillary (12/20 0741) BP: 157/74 mmHg (12/20 0800) Pulse Rate: 76 (12/20 0800)  Labs:  Recent Labs  11/07/15 1307  11/08/15 0027  11/09/15 0014 11/09/15 0302 11/09/15 1124 11/09/15 1906 11/10/15 0258  HGB  --   < > 13.2  --  12.5 13.6  --   --  14.6  HCT  --   < > 40.7  --  38.7 41.2  --   --  43.0  PLT  --   < > 207  --  217 241  --   --  172  HEPARINUNFRC  --   --  0.39  < >  --  0.21* 0.38 0.52 0.35  CREATININE  --   --  0.95  --  1.02* 0.98  --   --   --   TROPONINI 0.10*  --   --   --  0.10*  --   --   --   --   < > = values in this interval not displayed.  Estimated Creatinine Clearance: 40.9 mL/min (by C-G formula based on Cr of 0.98).   Medications:  Scheduled:  . aspirin EC  81 mg Oral Daily  . cefTRIAXone (ROCEPHIN)  IV  1 g Intravenous Q24H  . digoxin  0.0625 mg Oral Daily  . metoprolol tartrate  25 mg Oral QID   Infusions:  . sodium chloride 75 mL (11/08/15 0539)  . diltiazem (CARDIZEM) infusion 5 mg/hr (11/09/15 1253)  . heparin 900 Units/hr (11/09/15 2244)   PRN: acetaminophen **OR** acetaminophen, haloperidol lactate, levalbuterol, ondansetron **OR** ondansetron (ZOFRAN) IV, senna-docusate  Assessment: [name] on heparin for positive troponin and afib. HL thereapeutic at 0.35  Hgb 14.6, plts 172.  Pt Voided 100 mls red tinged urine on 12/19 pm. No further bleeding noted per RN.   Goal of Therapy:  Heparin level 0.3-0.7 units/ml Monitor platelets by anticoagulation protocol: Yes   Plan:  -Continue heparin gtt at 900 units/hr -daily HL and CBC -Monitor for s/sx of  bleeding  Cheyla Duchemin E Nisha Dhami 11/10/2015,8:36 AM

## 2015-11-10 NOTE — Evaluation (Signed)
Occupational Therapy Evaluation Patient Details Name: [name] MRN: [id number] DOB: 1935/09/12 Today's Date: 11/10/2015    History of Present Illness Patient is a 80 year old woman without medical history who presents to the hospital today after a syncopal episode. She states she awoke this morning to go to the bathroom and fell to the floor. No one was around hence she does not know how long she was unconscious, she does relate hitting her head. Upon arrival to the emergency department she is found to have a UTI, sound to be in atrial fibrillation with rapid ventricular response with rates in the 120s to 130s which is new onset for her, she was also found to have leukocytosis and acute renal failure and AMS   Clinical Impression   This 80 yo female admitted with above presents to acute OT with decreased balance, decreased mobility, decreased endurance, decreased insight into deficits, and generalized weakness all affecting her ability to be home by herself at a Mod I to independent level. Pt's BP dropped at end of session (asymptomatic) and pt placed back on 2 liters O2. She will benefit from acute OT with follow up OT at SNF to get to S level or better.    Follow Up Recommendations  SNF    Equipment Recommendations   (TBD at next venue)       Precautions / Restrictions Precautions Precautions: Fall Restrictions Weight Bearing Restrictions: No      Mobility Bed Mobility Overal bed mobility: Needs Assistance Bed Mobility: Supine to Sit     Supine to sit: Max assist        Transfers Overall transfer level: Needs assistance   Transfers: Sit to/from Stand;Squat Pivot Transfers Sit to Stand: Max assist   Squat pivot transfers: Max assist          Balance Overall balance assessment: Needs assistance Sitting-balance support: Bilateral upper extremity supported;Feet unsupported Sitting balance-Leahy Scale: Poor Sitting balance - Comments: varied between min A to  max A   Standing balance support: Bilateral upper extremity supported Standing balance-Leahy Scale: Zero                              ADL Overall ADL's : Needs assistance/impaired     Grooming: Wash/dry face;Supervision/safety;Set up;Sitting (Mod A for sitting balance at times)   Upper Body Bathing: Moderate assistance (with mod-max A for sitting balance at times at EOB)   Lower Body Bathing: Total assistance;+2 for physical assistance (+1 mod-max A sit<>stand and one to wash)   Upper Body Dressing : Total assistance (with mod -max for sitting balance at times)   Lower Body Dressing: Total assistance (+1 mod-max A sit<>stand and one for clothing)   Toilet Transfer: Maximal assistance;Squat-pivot;BSC   Toileting- Clothing Manipulation and Hygiene: Total assistance (+1 mod-max A sit<>stand and one to wash)                         Pertinent Vitals/Pain Pain Assessment:  (sore, when asked where she stated , "my whole upper body")     Hand Dominance Right   Extremity/Trunk Assessment Upper Extremity Assessment Upper Extremity Assessment: Generalized weakness   Lower Extremity Assessment Lower Extremity Assessment: Defer to PT evaluation       Communication Communication Communication: No difficulties   Cognition Arousal/Alertness: Awake/alert Behavior During Therapy: Flat affect Overall Cognitive Status: No family/caregiver present to determine baseline cognitive functioning  General Comments: Pt oriented to month, year, and upcoming holiday while at the same time she feels she could go home and take care of herself (even though it took one person to help her stand and another to help her wipe herself post urinating)              Home Living Family/patient expects to be discharged to:: Skilled nursing facility Living Arrangements: Alone Available Help at Discharge: Friend(s);Available PRN/intermittently (neighbor checks in  or her helps her out periodically) Type of Home: House Home Access: Stairs to enter     Home Layout: One level     Bathroom Shower/Tub: Tub/shower unit (sponges baths mostly now)         Home Equipment: Walker - 2 wheels          Prior Functioning/Environment Level of Independence: Independent with assistive device(s)        Comments: usually ambulated with a walker    OT Diagnosis: Generalized weakness;Cognitive deficits;Acute pain   OT Problem List: Decreased strength;Impaired balance (sitting and/or standing);Decreased knowledge of use of DME or AE;Pain;Cardiopulmonary status limiting activity   OT Treatment/Interventions: Self-care/ADL training;Patient/family education;Balance training;Therapeutic activities;DME and/or AE instruction    OT Goals(Current goals can be found in the care plan section) Acute Rehab OT Goals Patient Stated Goal: to go home OT Goal Formulation: With patient Time For Goal Achievement: 11/24/15 Potential to Achieve Goals: Fair  OT Frequency: Min 2X/week   Barriers to D/C: Decreased caregiver support          Co-evaluation PT/OT/SLP Co-Evaluation/Treatment: Yes Reason for Co-Treatment: For patient/therapist safety   OT goals addressed during session: ADL's and self-care;Strengthening/ROM      End of Session Equipment Utilized During Treatment: Gait belt Nurse Communication: Mobility status (BP at end of session, back on O2 at 2 liters at end of session)  Activity Tolerance: Patient limited by fatigue Patient left: in chair;with call bell/phone within reach;with chair alarm set   Time: 1035-1102 OT Time Calculation (min): 27 min Charges:  OT General Charges $OT Visit: 1 Procedure OT Evaluation $Initial OT Evaluation Tier I: 1 Procedure  Almon Register [id number] 11/10/2015, 11:42 AM

## 2015-11-11 DIAGNOSIS — R339 Retention of urine, unspecified: Secondary | ICD-10-CM

## 2015-11-11 DIAGNOSIS — R410 Disorientation, unspecified: Secondary | ICD-10-CM

## 2015-11-11 LAB — CBC
HEMATOCRIT: 43.5 % (ref 36.0–46.0)
HEMOGLOBIN: 14.3 g/dL (ref 12.0–15.0)
MCH: 29 pg (ref 26.0–34.0)
MCHC: 32.9 g/dL (ref 30.0–36.0)
MCV: 88.2 fL (ref 78.0–100.0)
PLATELETS: 226 10*3/uL (ref 150–400)
RBC: 4.93 MIL/uL (ref 3.87–5.11)
RDW: 14.9 % (ref 11.5–15.5)
WBC: 10.3 10*3/uL (ref 4.0–10.5)

## 2015-11-11 LAB — BASIC METABOLIC PANEL
Anion gap: 14 (ref 5–15)
BUN: 14 mg/dL (ref 6–20)
CALCIUM: 8.4 mg/dL — AB (ref 8.9–10.3)
CO2: 23 mmol/L (ref 22–32)
CREATININE: 0.68 mg/dL (ref 0.44–1.00)
Chloride: 99 mmol/L — ABNORMAL LOW (ref 101–111)
Glucose, Bld: 81 mg/dL (ref 65–99)
Potassium: 3.5 mmol/L (ref 3.5–5.1)
Sodium: 136 mmol/L (ref 135–145)

## 2015-11-11 LAB — URINALYSIS, ROUTINE W REFLEX MICROSCOPIC
BILIRUBIN URINE: NEGATIVE
GLUCOSE, UA: NEGATIVE mg/dL
KETONES UR: 15 mg/dL — AB
NITRITE: NEGATIVE
PH: 6.5 (ref 5.0–8.0)
PROTEIN: 100 mg/dL — AB
Specific Gravity, Urine: 1.013 (ref 1.005–1.030)

## 2015-11-11 LAB — URINE CULTURE: CULTURE: NO GROWTH

## 2015-11-11 LAB — URINE MICROSCOPIC-ADD ON

## 2015-11-11 MED ORDER — OFF THE BEAT BOOK
Freq: Once | Status: AC
  Administered 2015-11-11: 17:00:00
  Filled 2015-11-11: qty 1

## 2015-11-11 NOTE — Progress Notes (Signed)
[facility] PROGRESS NOTE  [name] [id number] DOB: [birth year] DOA: 11/06/2015 PCP: Purvis Kilts, MD  Assessment/Plan: 1. Urinary retention -[name] had guarding over her lower abdominal region on this mornings exam. Bladder felt distended. Bladder ultrasound was ordered and showed > 900 mL of urine. RN reporting she has been incontinence unclear what her urinary output has been. -Will check a urinalysis as well as a repeat BMP -Foley catheter placement ordered -May have been precipitated by decreased mobility, drugs, infection?  -Physical therapy consulted  2.  Suspected delirium -Veronica. Brady initially admitted for syncope in setting of A. fib. Hospitalization complicated by mental status changes likely related to delirium. -She was started on IV antibiotic therapy with ceftriaxone with suspicion of underlying infectious process precipitating delirium. -On this mornings exam, she remains lethargic. Will address urinary retention -Encourage mobility  3.  Atrial fibrillation with rapid ventricular response. -Telemetry showing ventricular rates of 80s to 90s this morning. -Cardiology treating with digoxin 0.625 g by mouth daily and metoprolol 25 mg by mouth 4 times daily.  4.  Urinary tract infection. -Patient had been treated with ceftriaxone. Developing urinary retention, will check repeat urinalysis  Code Status: Full code Family Communication: Family is not present Disposition Plan:    Antibiotics:  Ceftriaxone  HPI/Subjective: Patient remains lethargic, she is arousable, minimally verbal.  Objective: Filed Vitals:   11/11/15 0014 11/11/15 0427  BP: 157/87 181/82  Pulse: 82   Temp: 97.9 F (36.6 C) 97.5 F (36.4 C)  Resp: 18 19    Intake/Output Summary (Last 24 hours) at 11/11/15 0806 Last data filed at 11/10/15 1423  Gross per 24 hour  Intake 618.12 ml  Output      0 ml  Net 618.12 ml   Filed Weights   11/07/15 0511 11/07/15  1753 11/10/15 0200  Weight: 59.6 kg (131 lb 6.3 oz) 59.5 kg (131 lb 2.8 oz) 55.792 kg (123 lb)    Exam:   General:  Lethargic, arousable.  Cardiovascular: Irregular rate and rhythm normal S1-S2 no extremity edema  Respiratory: Normal respiratory effort, lungs are clear to auscultation bilaterally  Abdomen: She has pain to palpation over the lower abdominal region with the presence of guarding. There is palpable bladder, painful to palpation.  Musculoskeletal: No edema  Data Reviewed: Basic Metabolic Panel:  Recent Labs Lab 11/06/15 1022 11/07/15 0433 11/08/15 0027 11/09/15 0014 11/09/15 0302  NA 136 138 136 134* 135  K 4.5 3.6 3.6 3.6 3.7  CL 101 108 108 107 105  CO2 22 20* 19* 18* 20*  GLUCOSE 104* 97 123* 123* 144*  BUN 44* 33* 21* 23* 23*  CREATININE 1.44* 1.10* 0.95 1.02* 0.98  CALCIUM 9.5 8.4* 8.4* 8.6* 8.6*   Liver Function Tests:  Recent Labs Lab 11/10/15 0820  AST 21  ALT 18  ALKPHOS 72  BILITOT 1.3*  PROT 5.0*  ALBUMIN 2.8*   No results for input(s): LIPASE, AMYLASE in the last 168 hours.  Recent Labs Lab 11/09/15 1530  AMMONIA 25   CBC:  Recent Labs Lab 11/08/15 0027 11/09/15 0014 11/09/15 0302 11/10/15 0258 11/11/15 0419  WBC 9.4 7.7 8.8 10.1 10.3  NEUTROABS  --  5.7  --   --   --   HGB 13.2 12.5 13.6 14.6 14.3  HCT 40.7 38.7 41.2 43.0 43.5  MCV 91.1 90.4 90.0 87.8 88.2  PLT 207 217 241 172 226   Cardiac Enzymes:  Recent Labs Lab 11/06/15 1932 11/07/15 0048 11/07/15  0701 11/07/15 1307 11/09/15 0014  TROPONINI 0.05* 0.05* 0.04* 0.10* 0.10*   BNP (last 3 results)  Recent Labs  11/09/15 0014  BNP 1757.7*    ProBNP (last 3 results) No results for input(s): PROBNP in the last 8760 hours.  CBG:  Recent Labs Lab 11/06/15 1022  GLUCAP 88    Recent Results (from the past 240 hour(s))  Urine culture     Status: None   Collection Time: 11/06/15 11:47 AM  Result Value Ref Range Status   Specimen Description URINE,  CATHETERIZED  Final   Special Requests NONE  Final   Culture   Final    >=100,000 COLONIES/mL KLEBSIELLA OXYTOCA Performed at [facility]    Report Status 11/09/2015 FINAL  Final   Organism ID, Bacteria KLEBSIELLA OXYTOCA  Final      Susceptibility   Klebsiella oxytoca - MIC*    AMPICILLIN >=32 RESISTANT Resistant     CEFAZOLIN 32 INTERMEDIATE Intermediate     CEFTRIAXONE <=1 SENSITIVE Sensitive     CIPROFLOXACIN <=0.25 SENSITIVE Sensitive     GENTAMICIN <=1 SENSITIVE Sensitive     IMIPENEM <=0.25 SENSITIVE Sensitive     NITROFURANTOIN 32 SENSITIVE Sensitive     TRIMETH/SULFA <=20 SENSITIVE Sensitive     AMPICILLIN/SULBACTAM 16 INTERMEDIATE Intermediate     PIP/TAZO <=4 SENSITIVE Sensitive     * >=100,000 COLONIES/mL KLEBSIELLA OXYTOCA  MRSA PCR Screening     Status: None   Collection Time: 11/07/15  6:22 PM  Result Value Ref Range Status   MRSA by PCR NEGATIVE NEGATIVE Final    Comment:        The GeneXpert MRSA Assay (FDA approved for NASAL specimens only), is one component of a comprehensive MRSA colonization surveillance program. It is not intended to diagnose MRSA infection nor to guide or monitor treatment for MRSA infections.   Culture, blood (routine x 2)     Status: None (Preliminary result)   Collection Time: 11/09/15  3:30 PM  Result Value Ref Range Status   Specimen Description BLOOD LEFT HAND  Final   Special Requests IN PEDIATRIC BOTTLE 1CC  Final   Culture NO GROWTH < 24 HOURS  Final   Report Status PENDING  Incomplete  Culture, blood (routine x 2)     Status: None (Preliminary result)   Collection Time: 11/09/15  3:40 PM  Result Value Ref Range Status   Specimen Description BLOOD LEFT HAND  Final   Special Requests IN PEDIATRIC BOTTLE 1.5CC  Final   Culture NO GROWTH < 24 HOURS  Final   Report Status PENDING  Incomplete  C difficile quick scan w PCR reflex     Status: None   Collection Time: 11/09/15  8:32 PM  Result Value Ref Range Status    C Diff antigen NEGATIVE NEGATIVE Final   C Diff toxin NEGATIVE NEGATIVE Final   C Diff interpretation Negative for toxigenic C. difficile  Final     Studies: Dg Lumbar Spine 2-3 Views  11/10/2015  CLINICAL DATA:  Low back pain EXAM: LUMBAR SPINE - 2-3 VIEW COMPARISON:  Lumbar spine series of December 04, 2014 FINDINGS: Again demonstrated is approximately 50% anterior wedge compression of L1. There is no retropulsion of bone. There is mild disc space narrowing at T12-L1 and mild loss of height along the anterior inferior aspect of T12. The other lumbar vertebral bodies are preserved in height. The disc space heights are reasonably well-maintained. There is facet joint hypertrophy at L4-5 and  L5-S1 which is stable. There is calcification in the wall of the abdominal aorta without evidence of aneurysm. IMPRESSION: Chronic 50% L1 anterior wedge compression. Mild stable 20% anterior compression of T12. Mild degenerative facet joint change at L4-5 and L5-S1. No acute abnormality of the lumbar spine. Electronically Signed   By: David  Martinique M.D.   On: 11/10/2015 08:51   Dg Chest Port 1 View  11/09/2015  CLINICAL DATA:  Respiratory distress EXAM: PORTABLE CHEST 1 VIEW COMPARISON:  Chest radiograph from one day prior. FINDINGS: Stable cardiomediastinal silhouette with mild cardiomegaly. No pneumothorax. Small bilateral pleural effusions not appreciably changed. Mild pulmonary edema, slightly worsened. Bibasilar atelectasis is stable. IMPRESSION: 1. Mild congestive heart failure, slightly worsened. 2. Stable small bilateral pleural effusions with bibasilar atelectasis. Electronically Signed   By: Ilona Sorrel M.D.   On: 11/09/2015 14:30   Dg Abd Portable 1v  11/09/2015  CLINICAL DATA:  Abdominal pain EXAM: PORTABLE ABDOMEN - 1 VIEW COMPARISON:  12/04/2014 FINDINGS: Nonobstructive bowel gas pattern. No free air organomegaly. Calcifications project over the kidneys bilaterally. These could be vascular or  related to renal stones. Prior right hip replacement.  No acute bony abnormality. IMPRESSION: Possible bilateral renal stone versus vascular calcifications. No evidence of bowel obstruction. Electronically Signed   By: Rolm Baptise M.D.   On: 11/09/2015 14:28    Scheduled Meds: . aspirin EC  81 mg Oral Daily  . cefTRIAXone (ROCEPHIN)  IV  1 g Intravenous Q24H  . digoxin  0.0625 mg Oral Daily  . heparin  5,000 Units Subcutaneous 3 times per day  . metoprolol tartrate  25 mg Oral QID   Continuous Infusions:   Principal Problem:   Syncope Active Problems:   Dehydration   Atrial fibrillation with RVR (HCC)   UTI (lower urinary tract infection)   ARF (acute renal failure) (HCC)   Leukocytosis   VT (ventricular tachycardia) (HCC)   Acute encephalopathy   Physical deconditioning   Abdominal pain   Atrial fibrillation with rapid ventricular response (HCC)   Pressure ulcer   Chronic systolic CHF (congestive heart failure) ([place])    Time spent: 35 minutes    Kelvin Cellar  [facility] Pager [phone]. If 7PM-7AM, please contact night-coverage at [email or web address], password [facility] 11/11/2015, 8:06 AM  LOS: 5 days

## 2015-11-11 NOTE — Progress Notes (Addendum)
Follow up note  [name] had drainage of approx 1.5 L of grossly bloody urine with foley placement. RN did not not feel there was significant trauma with foley placement. Urology consulted, spoke with Dr Louis Meckel, recommending CT of ABD/Pelvis w and without contrast.

## 2015-11-11 NOTE — Consult Note (Signed)
Urology Consult  Referring physician: Kelvin Cellar, MD Reason for referral: Gross hematuria  Chief Complaint: Gross hematuria  History of Present Illness:  80 yo Brady currently admitted for A.Fib with RVR and Klebsiella UTI. She has been on ciprofloxacin since admission. While hospitalized she developed urinary retention yesterday with >900 mL on bladder scan. Foley catheter was placed with return of 1.5 mL of grossly blood urine. Per RN, catheter placement was not traumatic.  Repeat UCx from 11/10/15 returned NGTD.  She denies any history of acute urinary retention, recurrent UTIs, gross hematuria. She denies a personal or family history of kidney stones, bladder cancer, kidney cancer or other GU cancers. She reports that her "bladder pain" resolved with antibiotics and is no longer present.  Urology was consulted for urinary retention and gross hematuria.  Past Medical History  Diagnosis Date  . Arthritis   . CAD (coronary artery disease)     Nonobstructive by cath in 2010  . New onset atrial fibrillation ([place]) 10/2015  . Nonischemic cardiomyopathy ([place])     a. EF 35-40% in 2010 b. 20-25% in 10/2015   Past Surgical History  Procedure Laterality Date  . Total hip arthroplasty  2011    Medications: I have reviewed the patient's current medications. Allergies:  Allergies  Allergen Reactions  . Penicillins     Family History  Problem Relation Age of Onset  . Family history unknown: Yes   Social History:  reports that she has been smoking Cigarettes.  She does not have any smokeless tobacco history on file. She reports that she does not drink alcohol or use illicit drugs.  ROS 12 system review was performed and was negative except for the pertinent positives listed in the HPI.  Physical Exam:  Vital signs in last 24 hours: Temp:  [97.4 Brady (36.3 C)-98.1 Brady (36.7 C)] 98.1 Brady (36.7 C) (12/21 1625) Pulse Rate:  [65-96] 82 (12/21 1708) Resp:  [10-22] Veronica (12/21 1700) BP:  (116-181)/(54-88) 116/75 mmHg (12/21 1708) SpO2:  [93 %-99 %] 95 % (12/21 1700) Physical Exam  Constitutional: She appears well-developed. No distress.  HENT:  Head: Normocephalic and atraumatic.  Eyes: Conjunctivae are normal.  Respiratory: Effort normal. No respiratory distress.  GI: Soft. She exhibits no distension and no mass. There is no tenderness. There is no guarding.  No CVA tenderness bilaterally. Bladder not palpable. No suprapubic tenderness.  Genitourinary:  Foley catheter in place with light, thin red/brown urine in tubing with no clots or debris.    Laboratory Data:  Results for orders placed or performed during the hospital encounter of 11/06/15 (from the past 72 hour(s))  CBC with Differential/Platelet     Status: None   Collection Time: 11/09/15 12:14 AM  Result Value Ref Range   WBC 7.7 4.0 - 10.5 K/uL   RBC 4.28 3.87 - 5.11 MIL/uL   Hemoglobin 12.5 12.0 - 15.0 g/dL   HCT 38.7 36.0 - 46.0 %   MCV 90.4 78.0 - 100.0 fL   MCH 29.2 26.0 - 34.0 pg   MCHC 32.3 30.0 - 36.0 g/dL   RDW 15.1 11.5 - 15.5 %   Platelets 217 150 - 400 K/uL   Neutrophils Relative % 74 %   Neutro Abs 5.7 1.7 - 7.7 K/uL   Lymphocytes Relative Veronica %   Lymphs Abs 1.4 0.7 - 4.0 K/uL   Monocytes Relative 7 %   Monocytes Absolute 0.5 0.1 - 1.0 K/uL   Eosinophils Relative 1 %   Eosinophils  Absolute 0.0 0.0 - 0.7 K/uL   Basophils Relative 0 %   Basophils Absolute 0.0 0.0 - 0.1 K/uL  Brain natriuretic peptide     Status: Abnormal   Collection Time: 11/09/15 12:14 AM  Result Value Ref Range   B Natriuretic Peptide 1757.7 (H) 0.0 - 100.0 pg/mL  Troponin I     Status: Abnormal   Collection Time: 11/09/15 12:14 AM  Result Value Ref Range   Troponin I 0.10 (H) <0.031 ng/mL    Comment:        PERSISTENTLY INCREASED TROPONIN VALUES IN THE RANGE OF 0.04-0.49 ng/mL CAN BE SEEN IN:       -UNSTABLE ANGINA       -CONGESTIVE HEART FAILURE       -MYOCARDITIS       -CHEST TRAUMA       -ARRYHTHMIAS        -LATE PRESENTING MYOCARDIAL INFARCTION       -COPD   CLINICAL FOLLOW-UP RECOMMENDED.   Basic metabolic panel     Status: Abnormal   Collection Time: 11/09/15 12:14 AM  Result Value Ref Range   Sodium 134 (L) 135 - 145 mmol/L   Potassium 3.6 3.5 - 5.1 mmol/L   Chloride 107 101 - 111 mmol/L   CO2 Veronica (L) 22 - 32 mmol/L   Glucose, Bld 123 (H) 65 - 99 mg/dL   BUN 23 (H) 6 - 20 mg/dL   Creatinine, Ser 1.02 (H) 0.44 - 1.00 mg/dL   Calcium 8.6 (L) 8.9 - 10.3 mg/dL   GFR calc non Af Amer 51 (L) >60 mL/min   GFR calc Af Amer 59 (L) >60 mL/min    Comment: (NOTE) The eGFR has been calculated using the CKD EPI equation. This calculation has not been validated in all clinical situations. eGFR's persistently <60 mL/min signify possible Chronic Kidney Disease.    Anion gap 9 5 - 15  Heparin level (unfractionated)     Status: Abnormal   Collection Time: 11/09/15  3:02 AM  Result Value Ref Range   Heparin Unfractionated 0.21 (L) 0.30 - 0.70 IU/mL    Comment:        IF HEPARIN RESULTS ARE BELOW EXPECTED VALUES, AND PATIENT DOSAGE HAS BEEN CONFIRMED, SUGGEST FOLLOW UP TESTING OF ANTITHROMBIN III LEVELS.   CBC     Status: None   Collection Time: 11/09/15  3:02 AM  Result Value Ref Range   WBC 8.8 4.0 - 10.5 K/uL   RBC 4.58 3.87 - 5.11 MIL/uL   Hemoglobin 13.6 12.0 - 15.0 g/dL   HCT 41.2 36.0 - 46.0 %   MCV 90.0 78.0 - 100.0 fL   MCH 29.7 26.0 - 34.0 pg   MCHC 33.0 30.0 - 36.0 g/dL   RDW 15.2 11.5 - 15.5 %   Platelets 241 150 - 400 K/uL  Basic metabolic panel     Status: Abnormal   Collection Time: 11/09/15  3:02 AM  Result Value Ref Range   Sodium 135 135 - 145 mmol/L   Potassium 3.7 3.5 - 5.1 mmol/L   Chloride 105 101 - 111 mmol/L   CO2 20 (L) 22 - 32 mmol/L   Glucose, Bld 144 (H) 65 - 99 mg/dL   BUN 23 (H) 6 - 20 mg/dL   Creatinine, Ser 0.98 0.44 - 1.00 mg/dL   Calcium 8.6 (L) 8.9 - 10.3 mg/dL   GFR calc non Af Amer 54 (L) >60 mL/min   GFR calc Af Amer >60 >60  mL/min     Comment: (NOTE) The eGFR has been calculated using the CKD EPI equation. This calculation has not been validated in all clinical situations. eGFR's persistently <60 mL/min signify possible Chronic Kidney Disease.    Anion gap 10 5 - 15  Heparin level (unfractionated)     Status: None   Collection Time: 11/09/15 11:24 AM  Result Value Ref Range   Heparin Unfractionated 0.38 0.30 - 0.70 IU/mL    Comment:        IF HEPARIN RESULTS ARE BELOW EXPECTED VALUES, AND PATIENT DOSAGE HAS BEEN CONFIRMED, SUGGEST FOLLOW UP TESTING OF ANTITHROMBIN III LEVELS.   I-STAT 3, arterial blood gas (G3+)     Status: Abnormal   Collection Time: 11/09/15 12:05 PM  Result Value Ref Range   pH, Arterial 7.463 (H) 7.350 - 7.450   pCO2 arterial 31.9 (L) 35.0 - 45.0 mmHg   pO2, Arterial 111.0 (H) 80.0 - 100.0 mmHg   Bicarbonate 22.9 20.0 - 24.0 mEq/L   TCO2 24 0 - 100 mmol/L   O2 Saturation 99.0 %   Patient temperature 98.2 Brady    Collection site RADIAL, ALLEN'S TEST ACCEPTABLE    Drawn by RT    Sample type ARTERIAL   Lactic acid, plasma     Status: None   Collection Time: 11/09/15  3:30 PM  Result Value Ref Range   Lactic Acid, Venous 1.5 0.5 - 2.0 mmol/L  Ammonia     Status: None   Collection Time: 11/09/15  3:30 PM  Result Value Ref Range   Ammonia 25 9 - 35 umol/L  Culture, blood (routine x 2)     Status: None (Preliminary result)   Collection Time: 11/09/15  3:30 PM  Result Value Ref Range   Specimen Description BLOOD LEFT HAND    Special Requests IN PEDIATRIC BOTTLE 1CC    Culture NO GROWTH 2 DAYS    Report Status PENDING   Culture, blood (routine x 2)     Status: None (Preliminary result)   Collection Time: 11/09/15  3:40 PM  Result Value Ref Range   Specimen Description BLOOD LEFT HAND    Special Requests IN PEDIATRIC BOTTLE 1.5CC    Culture NO GROWTH 2 DAYS    Report Status PENDING   Heparin level (unfractionated)     Status: None   Collection Time: 11/09/15  7:06 PM  Result Value Ref  Range   Heparin Unfractionated 0.52 0.30 - 0.70 IU/mL    Comment:        IF HEPARIN RESULTS ARE BELOW EXPECTED VALUES, AND PATIENT DOSAGE HAS BEEN CONFIRMED, SUGGEST FOLLOW UP TESTING OF ANTITHROMBIN III LEVELS.   C difficile quick scan w PCR reflex     Status: None   Collection Time: 11/09/15  8:32 PM  Result Value Ref Range   C Diff antigen NEGATIVE NEGATIVE   C Diff toxin NEGATIVE NEGATIVE   C Diff interpretation Negative for toxigenic C. difficile   Heparin level (unfractionated)     Status: None   Collection Time: 11/10/15  2:58 AM  Result Value Ref Range   Heparin Unfractionated 0.35 0.30 - 0.70 IU/mL    Comment:        IF HEPARIN RESULTS ARE BELOW EXPECTED VALUES, AND PATIENT DOSAGE HAS BEEN CONFIRMED, SUGGEST FOLLOW UP TESTING OF ANTITHROMBIN III LEVELS.   CBC     Status: None   Collection Time: 11/10/15  2:58 AM  Result Value Ref Range   WBC 10.1 4.0 -  10.5 K/uL   RBC 4.90 3.87 - 5.11 MIL/uL   Hemoglobin 14.6 12.0 - 15.0 g/dL   HCT 43.0 36.0 - 46.0 %   MCV 87.8 78.0 - 100.0 fL   MCH 29.8 26.0 - 34.0 pg   MCHC 34.0 30.0 - 36.0 g/dL   RDW 14.9 11.5 - 15.5 %   Platelets 172 150 - 400 K/uL  Urinalysis, Routine w reflex microscopic (not at [facility])     Status: Abnormal   Collection Time: 11/10/15  6:24 AM  Result Value Ref Range   Color, Urine RED (A) YELLOW    Comment: BIOCHEMICALS MAY BE AFFECTED BY COLOR   APPearance CLOUDY (A) CLEAR   Specific Gravity, Urine 1.011 1.005 - 1.030   pH 7.0 5.0 - 8.0   Glucose, UA NEGATIVE NEGATIVE mg/dL   Hgb urine dipstick LARGE (A) NEGATIVE   Bilirubin Urine NEGATIVE NEGATIVE   Ketones, ur 40 (A) NEGATIVE mg/dL   Protein, ur 100 (A) NEGATIVE mg/dL   Nitrite NEGATIVE NEGATIVE   Leukocytes, UA SMALL (A) NEGATIVE  Culture, Urine     Status: None   Collection Time: 11/10/15  6:24 AM  Result Value Ref Range   Specimen Description URINE, RANDOM    Special Requests NONE    Culture NO GROWTH 1 DAY    Report Status 11/11/2015  FINAL   Urine microscopic-add on     Status: Abnormal   Collection Time: 11/10/15  6:24 AM  Result Value Ref Range   Squamous Epithelial / LPF 0-5 (A) NONE SEEN   WBC, UA 0-5 0 - 5 WBC/hpf   RBC / HPF TOO NUMEROUS TO COUNT 0 - 5 RBC/hpf   Bacteria, UA RARE (A) NONE SEEN  Hepatic function panel     Status: Abnormal   Collection Time: 11/10/15  8:20 AM  Result Value Ref Range   Total Protein 5.0 (L) 6.5 - 8.1 g/dL   Albumin 2.8 (L) 3.5 - 5.0 g/dL   AST 21 15 - 41 U/L   ALT Veronica 14 - 54 U/L   Alkaline Phosphatase 72 38 - 126 U/L   Total Bilirubin 1.3 (H) 0.3 - 1.2 mg/dL   Bilirubin, Direct 0.3 0.1 - 0.5 mg/dL   Indirect Bilirubin 1.0 (H) 0.3 - 0.9 mg/dL  CBC     Status: None   Collection Time: 11/11/15  4:19 AM  Result Value Ref Range   WBC 10.3 4.0 - 10.5 K/uL   RBC 4.93 3.87 - 5.11 MIL/uL   Hemoglobin 14.3 12.0 - 15.0 g/dL   HCT 43.5 36.0 - 46.0 %   MCV 88.2 78.0 - 100.0 fL   MCH 29.0 26.0 - 34.0 pg   MCHC 32.9 30.0 - 36.0 g/dL   RDW 14.9 11.5 - 15.5 %   Platelets 226 150 - 400 K/uL  Urinalysis, Routine w reflex microscopic (not at [facility])     Status: Abnormal   Collection Time: 11/11/15  8:38 AM  Result Value Ref Range   Color, Urine RED (A) YELLOW    Comment: URINALYSIS PERFORMED ON SUPERNATANT BIOCHEMICALS MAY BE AFFECTED BY COLOR    APPearance TURBID (A) CLEAR   Specific Gravity, Urine 1.013 1.005 - 1.030   pH 6.5 5.0 - 8.0   Glucose, UA NEGATIVE NEGATIVE mg/dL   Hgb urine dipstick LARGE (A) NEGATIVE   Bilirubin Urine NEGATIVE NEGATIVE   Ketones, ur 15 (A) NEGATIVE mg/dL   Protein, ur 100 (A) NEGATIVE mg/dL   Nitrite NEGATIVE NEGATIVE  Leukocytes, UA SMALL (A) NEGATIVE  Urine microscopic-add on     Status: Abnormal   Collection Time: 11/11/15  8:38 AM  Result Value Ref Range   Squamous Epithelial / LPF 0-5 (A) NONE SEEN   WBC, UA TOO NUMEROUS TO COUNT 0 - 5 WBC/hpf   RBC / HPF FIELD OBSCURED BY RBC'S 0 - 5 RBC/hpf   Bacteria, UA FEW (A) NONE SEEN  Basic  metabolic panel     Status: Abnormal   Collection Time: 11/11/15  9:25 AM  Result Value Ref Range   Sodium 136 135 - 145 mmol/L   Potassium 3.5 3.5 - 5.1 mmol/L   Chloride 99 (L) 101 - 111 mmol/L   CO2 23 22 - 32 mmol/L   Glucose, Bld 81 65 - 99 mg/dL   BUN 14 6 - 20 mg/dL   Creatinine, Ser 0.68 0.44 - 1.00 mg/dL   Calcium 8.4 (L) 8.9 - 10.3 mg/dL   GFR calc non Af Amer >60 >60 mL/min   GFR calc Af Amer >60 >60 mL/min    Comment: (NOTE) The eGFR has been calculated using the CKD EPI equation. This calculation has not been validated in all clinical situations. eGFR's persistently <60 mL/min signify possible Chronic Kidney Disease.    Anion gap 14 5 - 15   Recent Results (from the past 240 hour(s))  Urine culture     Status: None   Collection Time: 11/06/15 11:47 AM  Result Value Ref Range Status   Specimen Description URINE, CATHETERIZED  Final   Special Requests NONE  Final   Culture   Final    >=100,000 COLONIES/mL KLEBSIELLA OXYTOCA Performed at [facility]    Report Status 11/09/2015 FINAL  Final   Organism ID, Bacteria KLEBSIELLA OXYTOCA  Final      Susceptibility   Klebsiella oxytoca - MIC*    AMPICILLIN >=32 RESISTANT Resistant     CEFAZOLIN 32 INTERMEDIATE Intermediate     CEFTRIAXONE <=1 SENSITIVE Sensitive     CIPROFLOXACIN <=0.25 SENSITIVE Sensitive     GENTAMICIN <=1 SENSITIVE Sensitive     IMIPENEM <=0.25 SENSITIVE Sensitive     NITROFURANTOIN 32 SENSITIVE Sensitive     TRIMETH/SULFA <=20 SENSITIVE Sensitive     AMPICILLIN/SULBACTAM 16 INTERMEDIATE Intermediate     PIP/TAZO <=4 SENSITIVE Sensitive     * >=100,000 COLONIES/mL KLEBSIELLA OXYTOCA  MRSA PCR Screening     Status: None   Collection Time: 11/07/15  6:22 PM  Result Value Ref Range Status   MRSA by PCR NEGATIVE NEGATIVE Final    Comment:        The GeneXpert MRSA Assay (FDA approved for NASAL specimens only), is one component of a comprehensive MRSA colonization surveillance program.  It is not intended to diagnose MRSA infection nor to guide or monitor treatment for MRSA infections.   Culture, blood (routine x 2)     Status: None (Preliminary result)   Collection Time: 11/09/15  3:30 PM  Result Value Ref Range Status   Specimen Description BLOOD LEFT HAND  Final   Special Requests IN PEDIATRIC BOTTLE 1CC  Final   Culture NO GROWTH 2 DAYS  Final   Report Status PENDING  Incomplete  Culture, blood (routine x 2)     Status: None (Preliminary result)   Collection Time: 11/09/15  3:40 PM  Result Value Ref Range Status   Specimen Description BLOOD LEFT HAND  Final   Special Requests IN PEDIATRIC BOTTLE 1.5CC  Final  Culture NO GROWTH 2 DAYS  Final   Report Status PENDING  Incomplete  C difficile quick scan w PCR reflex     Status: None   Collection Time: 11/09/15  8:32 PM  Result Value Ref Range Status   C Diff antigen NEGATIVE NEGATIVE Final   C Diff toxin NEGATIVE NEGATIVE Final   C Diff interpretation Negative for toxigenic C. difficile  Final  Culture, Urine     Status: None   Collection Time: 11/10/15  6:24 AM  Result Value Ref Range Status   Specimen Description URINE, RANDOM  Final   Special Requests NONE  Final   Culture NO GROWTH 1 DAY  Final   Report Status 11/11/2015 FINAL  Final   Creatinine:  Recent Labs  11/06/15 1022 11/07/15 0433 12/Veronica/16 0027 11/09/15 0014 11/09/15 0302 11/11/15 0925  CREATININE 1.44* 1.10* 0.95 1.02* 0.98 0.68   Impression/Assessment:  [name] admitted for A.Fib with RVR, UTI and AMS who developed acute urinary retention and gross hematuria. Has been on antibiotics since 11/06/15 and repeat UCx on 11/10/15 is NGTD. As such, gross hematuria is not explained by UTI and foley placement was atraumatic per RN. Given this, we recommend proceeding with gross hematuria evaluation.  Plan:  1. Recommend keeping foley indwelling for at least 1 week given bladder stretch injury 2. Recommend CT Urogram prior to discharge to  evaluate upper tracts 3. Recommend outpatient cystoscopy with urine cytology  4. Will schedule follow up with Dr. Louis Meckel at discharge for cystoscopy and cytology to complete gross hematuria work up  [employer] 11/11/2015, 5:37 PM

## 2015-11-11 NOTE — Progress Notes (Signed)
SUBJECTIVE:  Patient is not as responsive today. Opens eyes but doesn't talk or communicate with me.    PHYSICAL EXAM Filed Vitals:   11/11/15 0830 11/11/15 0845 11/11/15 0900 11/11/15 1127  BP: 122/58 121/61 134/65 132/54  Pulse: 92     Temp: 97.4 F (36.3 C)   97.5 F (36.4 C)  TempSrc: Axillary   Axillary  Resp: 10 11 13 22   Height:      Weight:      SpO2: 98%   96%   General:  Elderly thin WF in NAD Lungs:  Decreased breath sounds- diffusely. No rales or rhonchi. Heart:  Irregular, no gallop or murmur.  Abdomen:  Positive bowel sounds, nontender.  Extremities:  No edema Neuro:  Nonfocal  LABS: Lab Results  Component Value Date   TROPONINI 0.10* 11/09/2015   Results for orders placed or performed during the hospital encounter of 11/06/15 (from the past 24 hour(s))  CBC     Status: None   Collection Time: 11/11/15  4:19 AM  Result Value Ref Range   WBC 10.3 4.0 - 10.5 K/uL   RBC 4.93 3.87 - 5.11 MIL/uL   Hemoglobin 14.3 12.0 - 15.0 g/dL   HCT 43.5 36.0 - 46.0 %   MCV 88.2 78.0 - 100.0 fL   MCH 29.0 26.0 - 34.0 pg   MCHC 32.9 30.0 - 36.0 g/dL   RDW 14.9 11.5 - 15.5 %   Platelets 226 150 - 400 K/uL  Urinalysis, Routine w reflex microscopic (not at [facility])     Status: Abnormal   Collection Time: 11/11/15  8:38 AM  Result Value Ref Range   Color, Urine RED (A) YELLOW   APPearance TURBID (A) CLEAR   Specific Gravity, Urine 1.013 1.005 - 1.030   pH 6.5 5.0 - 8.0   Glucose, UA NEGATIVE NEGATIVE mg/dL   Hgb urine dipstick LARGE (A) NEGATIVE   Bilirubin Urine NEGATIVE NEGATIVE   Ketones, ur 15 (A) NEGATIVE mg/dL   Protein, ur 100 (A) NEGATIVE mg/dL   Nitrite NEGATIVE NEGATIVE   Leukocytes, UA SMALL (A) NEGATIVE  Urine microscopic-add on     Status: Abnormal   Collection Time: 11/11/15  8:38 AM  Result Value Ref Range   Squamous Epithelial / LPF 0-5 (A) NONE SEEN   WBC, UA TOO NUMEROUS TO COUNT 0 - 5 WBC/hpf   RBC / HPF FIELD OBSCURED BY RBC'S 0 - 5 RBC/hpf   Bacteria, UA FEW (A) NONE SEEN  Basic metabolic panel     Status: Abnormal   Collection Time: 11/11/15  9:25 AM  Result Value Ref Range   Sodium 136 135 - 145 mmol/L   Potassium 3.5 3.5 - 5.1 mmol/L   Chloride 99 (L) 101 - 111 mmol/L   CO2 23 22 - 32 mmol/L   Glucose, Bld 81 65 - 99 mg/dL   BUN 14 6 - 20 mg/dL   Creatinine, Ser 0.68 0.44 - 1.00 mg/dL   Calcium 8.4 (L) 8.9 - 10.3 mg/dL   GFR calc non Af Amer >60 >60 mL/min   GFR calc Af Amer >60 >60 mL/min   Anion gap 14 5 - 15    Intake/Output Summary (Last 24 hours) at 11/11/15 1207 Last data filed at 11/11/15 0900  Gross per 24 hour  Intake 276.12 ml  Output   1425 ml  Net -1148.88 ml    EKG:   11/08/15: Atrial fib, rapid rate, low voltage, poor anterior R wave progression,  diffuse nonspecific T wave changes.   Telemetry: Atrial fibrillation with controlled response   ASSESSMENT AND PLAN:  1. ATRIAL FIB:   Rate control improved.  Now on oral therapy with  metoprolol and dig.   Afib is of unknown duration.  Long term anticoagulation will be a significant problem  with history of  frequent falls. Reports at least 10 falls in last 6 months.   [name] has a CHA2DS2 - VASc score of 5 with a risk of stroke of 6.7%.   She also has poor social support living alone. Will hold off on anticoagulation until we can assure the proper environment for safety. In addition she was noted to have bright red blood in urine with urinary retention.  2. SYNCOPE:    She denies this although her neighbor reports multiple episodes.    3. MR:  Moderate by echo this admission.  Follow clinically.    4. ELEVATED TROPONIN:  Suspect that this is related to rapid rate with demand ischemia.  Mild elevation with flat trend.   I am not planning an invasive work up at this point.   5. Chronic systolic CHF with nonischemic cardiomyopathy:  Presumed non ischemic with EF now 20%.  Was 61 - 40% with nonobstructive CAD on cath in 2010.  Will titrate meds  as tolerated.  6. PVD:  90% distal aortic narrowing noted by Dr. Claiborne Billings on cath 2010.    7. UTI. Klebsiella per primary team. Now with urinary retention.  8. Mental status changes. Negative CT head on 12/16. Improved with treatment of infection. Ammonia level and ABG OK.      Collier [facility] 11/11/2015 12:07 PM

## 2015-11-12 ENCOUNTER — Inpatient Hospital Stay (HOSPITAL_COMMUNITY): Payer: Medicare HMO

## 2015-11-12 ENCOUNTER — Encounter (HOSPITAL_COMMUNITY): Payer: Self-pay | Admitting: Radiology

## 2015-11-12 DIAGNOSIS — R339 Retention of urine, unspecified: Secondary | ICD-10-CM | POA: Insufficient documentation

## 2015-11-12 LAB — CBC
HCT: 45.4 % (ref 36.0–46.0)
Hemoglobin: 15.4 g/dL — ABNORMAL HIGH (ref 12.0–15.0)
MCH: 29.7 pg (ref 26.0–34.0)
MCHC: 33.9 g/dL (ref 30.0–36.0)
MCV: 87.5 fL (ref 78.0–100.0)
PLATELETS: 185 10*3/uL (ref 150–400)
RBC: 5.19 MIL/uL — AB (ref 3.87–5.11)
RDW: 15.2 % (ref 11.5–15.5)
WBC: 10.3 10*3/uL (ref 4.0–10.5)

## 2015-11-12 LAB — BASIC METABOLIC PANEL
ANION GAP: 12 (ref 5–15)
BUN: 14 mg/dL (ref 6–20)
CALCIUM: 8.3 mg/dL — AB (ref 8.9–10.3)
CO2: 26 mmol/L (ref 22–32)
Chloride: 96 mmol/L — ABNORMAL LOW (ref 101–111)
Creatinine, Ser: 0.75 mg/dL (ref 0.44–1.00)
GLUCOSE: 71 mg/dL (ref 65–99)
POTASSIUM: 2.7 mmol/L — AB (ref 3.5–5.1)
Sodium: 134 mmol/L — ABNORMAL LOW (ref 135–145)

## 2015-11-12 MED ORDER — METOPROLOL TARTRATE 50 MG PO TABS
50.0000 mg | ORAL_TABLET | Freq: Two times a day (BID) | ORAL | Status: DC
  Administered 2015-11-12 – 2015-11-13 (×3): 50 mg via ORAL
  Filled 2015-11-12 (×3): qty 1

## 2015-11-12 MED ORDER — POTASSIUM CHLORIDE 10 MEQ/100ML IV SOLN
10.0000 meq | INTRAVENOUS | Status: AC
  Administered 2015-11-12 (×2): 10 meq via INTRAVENOUS
  Filled 2015-11-12 (×2): qty 100

## 2015-11-12 MED ORDER — LISINOPRIL 5 MG PO TABS
5.0000 mg | ORAL_TABLET | Freq: Every day | ORAL | Status: DC
  Administered 2015-11-12 – 2015-11-13 (×2): 5 mg via ORAL
  Filled 2015-11-12 (×2): qty 1

## 2015-11-12 MED ORDER — POTASSIUM CHLORIDE CRYS ER 20 MEQ PO TBCR
40.0000 meq | EXTENDED_RELEASE_TABLET | Freq: Once | ORAL | Status: AC
  Administered 2015-11-12: 40 meq via ORAL
  Filled 2015-11-12: qty 2

## 2015-11-12 MED ORDER — POTASSIUM CHLORIDE CRYS ER 20 MEQ PO TBCR
40.0000 meq | EXTENDED_RELEASE_TABLET | Freq: Four times a day (QID) | ORAL | Status: AC
Start: 2015-11-12 — End: 2015-11-13
  Administered 2015-11-12: 40 meq via ORAL
  Filled 2015-11-12: qty 2

## 2015-11-12 MED ORDER — IOHEXOL 300 MG/ML  SOLN
125.0000 mL | Freq: Once | INTRAMUSCULAR | Status: AC | PRN
  Administered 2015-11-12: 125 mL via INTRAVENOUS

## 2015-11-12 NOTE — Progress Notes (Signed)
Hematuria Consult  Intv:  Foley catheter placed - draining without issue.  Urine clearing - light red this AM.   CT scan done.  No complaints of leakage around catheter or bladder pain.  No flank pain. Afebrile.  Filed Vitals:   11/12/15 0700 11/12/15 0708 11/12/15 0800 11/12/15 1110  BP: 173/55  136/74 128/60  Pulse: 86  90 91  Temp:  97.5 F (36.4 C)    TempSrc:  Axillary    Resp: 15  15   Height:      Weight:      SpO2: 96%  99%    NAD Non-labored breathing Abdomen soft Foley draining light red urine Ext symmetric   Recent Labs  11/10/15 0258 11/11/15 0419 11/12/15 0253  WBC 10.1 10.3 10.3  HGB 14.6 14.3 15.4*  HCT 43.0 43.5 45.4    Recent Labs  11/11/15 0925 11/12/15 0253  NA 136 134*  K 3.5 2.7*  CL 99* 96*  CO2 23 26  GLUCOSE 81 71  BUN 14 14  CREATININE 0.68 0.75  CALCIUM 8.4* 8.3*   No results for input(s): LABPT, INR in the last 72 hours. No results for input(s): PSA in the last 72 hours. No results for input(s): LABURIN in the last 72 hours. Results for orders placed or performed during the hospital encounter of 11/06/15  Urine culture     Status: None   Collection Time: 11/06/15 11:47 AM  Result Value Ref Range Status   Specimen Description URINE, CATHETERIZED  Final   Special Requests NONE  Final   Culture   Final    >=100,000 COLONIES/mL KLEBSIELLA OXYTOCA Performed at [facility]    Report Status 11/09/2015 FINAL  Final   Organism ID, Bacteria KLEBSIELLA OXYTOCA  Final      Susceptibility   Klebsiella oxytoca - MIC*    AMPICILLIN >=32 RESISTANT Resistant     CEFAZOLIN 32 INTERMEDIATE Intermediate     CEFTRIAXONE <=1 SENSITIVE Sensitive     CIPROFLOXACIN <=0.25 SENSITIVE Sensitive     GENTAMICIN <=1 SENSITIVE Sensitive     IMIPENEM <=0.25 SENSITIVE Sensitive     NITROFURANTOIN 32 SENSITIVE Sensitive     TRIMETH/SULFA <=20 SENSITIVE Sensitive     AMPICILLIN/SULBACTAM 16 INTERMEDIATE Intermediate     PIP/TAZO <=4 SENSITIVE  Sensitive     * >=100,000 COLONIES/mL KLEBSIELLA OXYTOCA  MRSA PCR Screening     Status: None   Collection Time: 11/07/15  6:22 PM  Result Value Ref Range Status   MRSA by PCR NEGATIVE NEGATIVE Final    Comment:        The GeneXpert MRSA Assay (FDA approved for NASAL specimens only), is one component of a comprehensive MRSA colonization surveillance program. It is not intended to diagnose MRSA infection nor to guide or monitor treatment for MRSA infections.   Culture, blood (routine x 2)     Status: None (Preliminary result)   Collection Time: 11/09/15  3:30 PM  Result Value Ref Range Status   Specimen Description BLOOD LEFT HAND  Final   Special Requests IN PEDIATRIC BOTTLE 1CC  Final   Culture NO GROWTH 2 DAYS  Final   Report Status PENDING  Incomplete  Culture, blood (routine x 2)     Status: None (Preliminary result)   Collection Time: 11/09/15  3:40 PM  Result Value Ref Range Status   Specimen Description BLOOD LEFT HAND  Final   Special Requests IN PEDIATRIC BOTTLE 1.5CC  Final   Culture NO  GROWTH 2 DAYS  Final   Report Status PENDING  Incomplete  C difficile quick scan w PCR reflex     Status: None   Collection Time: 11/09/15  8:32 PM  Result Value Ref Range Status   C Diff antigen NEGATIVE NEGATIVE Final   C Diff toxin NEGATIVE NEGATIVE Final   C Diff interpretation Negative for toxigenic C. difficile  Final  Culture, Urine     Status: None   Collection Time: 11/10/15  6:24 AM  Result Value Ref Range Status   Specimen Description URINE, RANDOM  Final   Special Requests NONE  Final   Culture NO GROWTH 1 DAY  Final   Report Status 11/11/2015 FINAL  Final    CT - small non-obstructing stone in the right lower pole.  Distal ureters and bladder obscured by beam hardening from hip replacement.    Imp: Gross hematuria - source unclear.   Urine clearing. UTI and stone possible explanations although she still needs cysto to r/o any significant bladder pathology  Rec:  Keep foley in 7 days.  F/u as outpatient for cystoscopy.  UTI treatment per primary team.  Please contact urology with additional questions/concerns.  We will continue to follow from [place].  Thank you for involving urology in this patient's care.

## 2015-11-12 NOTE — Progress Notes (Signed)
CRITICAL VALUE ALERT  Critical value received:  K+ of 2.7  Date of notification:  11/12/15  Time of notification:  [id number]  Critical value read back:Yes.    Nurse who received alert:  Epimenio Sarin, RN   MD notified (1st page):  Dr. Leonidas Romberg   Time of first page:  0425  MD notified (2nd page):  Time of second page:  Responding MD:  Dr. Leonidas Romberg   Time MD responded:  [phone]

## 2015-11-12 NOTE — NC FL2 (Signed)
[place] LEVEL OF CARE SCREENING TOOL     IDENTIFICATION  Patient Name: [name] Birthdate: 1934/12/03 Sex: female Admission Date (Current Location): 11/06/2015  [facility] and [place] Number:  [employer] and Address:  The [place], [place] [address] [place], [place] [address]      Provider Number: [id number]  Attending Physician Name and Address:  Kelvin Cellar, MD  Relative Name and Phone Number:       Current Level of Care: Hospital Recommended Level of Care: [place] Prior Approval Number:    Date Approved/Denied:   PASRR Number: [id number]  Discharge Plan: SNF    Current Diagnoses: Patient Active Problem List   Diagnosis Date Noted  . Pressure ulcer 11/10/2015  . Chronic systolic CHF (congestive heart failure) ([place])   . Acute encephalopathy 11/09/2015  . Physical deconditioning 11/09/2015  . Abdominal pain   . Atrial fibrillation with rapid ventricular response ([place])   . VT (ventricular tachycardia) ([place]) 11/07/2015  . Dehydration 11/06/2015  . Syncope 11/06/2015  . Atrial fibrillation with RVR ([place]) 11/06/2015  . UTI (lower urinary tract infection) 11/06/2015  . ARF (acute renal failure) ([place]) 11/06/2015  . Leukocytosis 11/06/2015    Orientation RESPIRATION BLADDER Height & Weight    Self, Time, Situation, Place  O2 Continent 5\' 4"  (162.6 cm) 123 lbs.  BEHAVIORAL SYMPTOMS/MOOD NEUROLOGICAL BOWEL NUTRITION STATUS      Continent Diet (please check dc summary for dietary restrictions if any)  AMBULATORY STATUS COMMUNICATION OF NEEDS Skin   Limited Assist Verbally PU Stage and Appropriate Care                       Personal Care Assistance Level of Assistance  Bathing, Dressing Bathing Assistance: Limited assistance   Dressing Assistance: Limited assistance     Functional Limitations Info             SPECIAL CARE FACTORS FREQUENCY  PT (By licensed PT), OT (By  licensed OT)     PT Frequency: daily OT Frequency: daily            Contractures Contractures Info: Not present    Additional Factors Info      Allergies Info: Penicillins           Current Medications (11/12/2015):  This is the current hospital active medication list Current Facility-Administered Medications  Medication Dose Route Frequency Provider Last Rate Last Dose  . acetaminophen (TYLENOL) tablet 650 mg  650 mg Oral Q6H PRN Erline Hau, MD       Or  . acetaminophen (TYLENOL) suppository 650 mg  650 mg Rectal Q6H PRN Erline Hau, MD      . aspirin EC tablet 81 mg  81 mg Oral Daily Raliegh Ip, MD   81 mg at 11/12/15 1110  . cefTRIAXone (ROCEPHIN) 1 g in dextrose 5 % 50 mL IVPB  1 g Intravenous Q24H Waldemar Dickens, MD   1 g at 11/11/15 1400  . digoxin (LANOXIN) tablet 0.0625 mg  0.0625 mg Oral Daily Minus Breeding, MD   0.0625 mg at 11/12/15 1111  . haloperidol lactate (HALDOL) injection 2 mg  2 mg Intravenous Q6H PRN Lendon Colonel, NP   2 mg at 11/11/15 0015  . heparin injection 5,000 Units  5,000 Units Subcutaneous 3 times per day Peter M Martinique, MD   5,000 Units at 11/12/15 0557  . iohexol (OMNIPAQUE)  300 MG/ML solution 125 mL  125 mL Intravenous Once PRN Kelvin Cellar, MD      . levalbuterol Penne Lash) nebulizer solution 1.25 mg  1.25 mg Nebulization Q6H PRN Hewitt Shorts [place], PA-C   1.25 mg at 11/08/15 2333  . lisinopril (PRINIVIL,ZESTRIL) tablet 5 mg  5 mg Oral Daily Peter M Martinique, MD   5 mg at 11/12/15 1111  . metoprolol (LOPRESSOR) tablet 50 mg  50 mg Oral BID Peter M Martinique, MD   50 mg at 11/12/15 1110  . ondansetron (ZOFRAN) tablet 4 mg  4 mg Oral Q6H PRN Erline Hau, MD       Or  . ondansetron [facility]) injection 4 mg  4 mg Intravenous Q6H PRN Erline Hau, MD      . potassium chloride SA (K-DUR,KLOR-CON) CR tablet 40 mEq  40 mEq Oral Q6H Kelvin Cellar, MD      . senna-docusate (Senokot-S) tablet 1  tablet  1 tablet Oral QHS PRN Erline Hau, MD         Discharge Medications: Please see discharge summary for a list of discharge medications.  Relevant Imaging Results:  Relevant Lab Results:   Additional Information SSN: [id number]  Dulcy Fanny, LCSW

## 2015-11-12 NOTE — Progress Notes (Signed)
Patient refused to take scheduled potassium pills tonight, RN educated patient on her low potassium level, pt stated, " I will not take any pills tonight, i'll take them in the morning". Patient verbalized understanding, RN will continue to monitor.  Sharene Skeans, RN

## 2015-11-12 NOTE — Clinical Social Work Note (Signed)
Clinical Social Work Assessment  Patient Details  Name: [name] MRN: [id number] Date of Birth: Oct 16, 1935  Date of referral:  11/12/15               Reason for consult:  Facility Placement                Permission sought to share information with:  Family Supports, Case Freight forwarder, Chartered certified accountant granted to share information::  No (patient not oriented)  Name::      (brother, Geneticist, molecular)  Chief Strategy Officer::   (SNFs in [place] choice is [facility])  Relationship::     Contact Information:     Housing/Transportation Living arrangements for the past 2 months:  [place] of Information:  Other (Comment Required) (brother, [name]) Patient Interpreter Needed:  None Criminal Activity/Legal Involvement Pertinent to Current Situation/Hospitalization:  No - Comment as needed Significant Relationships:  Siblings (brother and sister) Lives with:  Self Do you feel safe going back to the place where you live?  No (frequent falls) Need for family participation in patient care:  Yes (Comment) (patient not oriented to make placement decisions at this time)  Care giving concerns:  CSW spoke with patient's brother, [name], who reports his feelings of patient being unable to live independently any longer based on her frequent falls and inability to care for self.   Social Worker assessment / plan:  CSW received consult for SNF placement due to PT recommendations.  Patient is alert though only oriented to person and date.  Patient's closest relative is brother, [name] and sister.  CSW contacted brother to complete assessment.  Brother reports patient living independently in [place].  Danny lives in [place] and sister lives in [place].  [name] reports patient fell down and was found down by her neighbor when EMS was called and took the patient to [facility] where she was later transferred to [facility].  Brother is agreeable to  SNF placement and referrals being sent to [facility].  Danny's first choice for placement is [facility].  CSW will continue to follow and assist with discharge planning and provide bed offers once available.  Employment status:  Retired Forensic scientist:  Office manager) PT Recommendations:  [place] / Referral to community resources:  [place]  Patient/Family's Response to care:  Brother, [name] is agreeable to SNF placement.  Wishes for referrals to be sent to [facility] and first choice is [facility].  Patient/Family's Understanding of and Emotional Response to Diagnosis, Current Treatment, and Prognosis:  [name] is realistic regarding patient's prognosis and needed level of care at time of discharge.  Emotional Assessment Appearance:  Appears stated age Attitude/Demeanor/Rapport:  Unable to Assess Affect (typically observed):  Unable to Assess Orientation:  Fluctuating Orientation (Suspected and/or reported Sundowners) Alcohol / Substance use:  Not Applicable Psych involvement (Current and /or in the community):  No (Comment)  Discharge Needs  Concerns to be addressed:  No discharge needs identified Readmission within the last 30 days:  No Current discharge risk:  Lives alone Barriers to Discharge:  Continued Medical Work up   [employer], LCSW 11/12/2015, 11:03 AM

## 2015-11-12 NOTE — Care Management Important Message (Signed)
Important Message  Patient Details  Name: [name] MRN: [id number] Date of Birth: [birth year]   Medicare Important Message Given:  Yes    Lindsi Bayliss P Malasha Kleppe 11/12/2015, 1:47 PM

## 2015-11-12 NOTE — Clinical Social Work Note (Signed)
CSW left message for [facility] to request review of referral for possible SNF placement at time of discharge.  If SNF able to offer bed, CSW requested SNF begin [facility] authorization for possible holiday discharge.  Per chart review, patient is now alert and oriented and placement decisions will be made by patient and no longer the brother.  Patient transferred from [facility] to 2W.  Unit CSW to follow up.  Handoff given.    Nonnie Done, LCSW [phone]  [place] [id number]  Licensed Clinical Social Worker

## 2015-11-12 NOTE — Progress Notes (Signed)
SNF bed search sent out to [facility], [facility] and [facility].  Patient has [facility] which will require prior authorization prior to SNF placement. Awaiting decision from MD re: stability.  Lorie Phenix. Pauline Good, [place]

## 2015-11-12 NOTE — Progress Notes (Signed)
Physical Therapy Treatment Patient Details Name: [name] MRN: [id number] DOB: 04-Jun-1935 Today's Date: 11/12/2015    History of Present Illness Patient is a 80 year old woman without medical history who presents to the hospital today after a syncopal episode. She states she awoke this morning to go to the bathroom and fell to the floor. No one was around hence she does not know how long she was unconscious, she does relate hitting her head. Upon arrival to the emergency department she is found to have a UTI, sound to be in atrial fibrillation with rapid ventricular response with rates in the 120s to 130s which is new onset for her, she was also found to have leukocytosis and acute renal failure and AMS    PT Comments    Patient progressing with sitting balance and transfers some this session.  Remains globally weak and has limited activity tolerance due to back pain.  Good candidate for SNF level rehab.  Will continue skilled PT in the acute setting.  Follow Up Recommendations  SNF;Supervision/Assistance - 24 hour     Equipment Recommendations  Rolling walker with 5" wheels    Recommendations for Other Services       Precautions / Restrictions Precautions Precautions: Fall    Mobility  Bed Mobility Overal bed mobility: Needs Assistance Bed Mobility: Sit to Supine     Supine to sit: Mod assist;HOB elevated Sit to supine: Mod assist   General bed mobility comments: pt moved legs off bed, assisted to lift trunk and scoot to EOB; to supine assisted feet one at a time as pt lifted them partway into bed  Transfers   Equipment used: Rolling walker (2 wheeled) Transfers: Sit to/from [employer] Sit to Stand: Max assist   Squat pivot transfers: Mod assist     General transfer comment: lifted pt up to stand at walker, she needed lifting and lowering help and did not tolerate standing long; attmepted to engage again, but pt refused due to back pain so  pivot along EOB to get closer to [facility] prior to returning to supine  Ambulation/Gait                 Stairs            Wheelchair Mobility    Modified Rankin (Stroke Patients Only)       Balance   Sitting-balance support: Feet supported;Bilateral upper extremity supported Sitting balance-Leahy Scale: Poor Sitting balance - Comments: sat EOB about 7 minutes min to mod support and flexed posture; cues and facilitation for trunk extension and pt performed x 1 sustained about 10 s                            Cognition Arousal/Alertness: Awake/alert Behavior During Therapy: WFL for tasks assessed/performed Overall Cognitive Status: No family/caregiver present to determine baseline cognitive functioning                      Exercises General Exercises - Lower Extremity Ankle Circles/Pumps: AROM;Both;10 reps;Supine Short Arc Quad: AROM;Both;10 reps;Supine Heel Slides: AROM;Both;10 reps;Supine Low Level/ICU Exercises Stabilized Bridging: Strengthening;Both;10 reps;Supine    General Comments        Pertinent Vitals/Pain Faces Pain Scale: Hurts even more Pain Location: lower back in sitting Pain Descriptors / Indicators: Aching Pain Intervention(s): Repositioned;Monitored during session;Limited activity within patient's tolerance    Home Living  Prior Function            PT Goals (current goals can now be found in the care plan section) Progress towards PT goals: Progressing toward goals    Frequency  Min 3X/week    PT Plan Current plan remains appropriate    Co-evaluation             End of Session Equipment Utilized During Treatment: Gait belt Activity Tolerance: Patient limited by pain Patient left: in bed;with call bell/phone within reach;with bed alarm set     Time: 1520-1547 PT Time Calculation (min) (ACUTE ONLY): 27 min  Charges:  $Therapeutic Exercise: 8-22 mins $Therapeutic Activity:  8-22 mins                    G Codes:      Reginia Naas 2015/12/12, 4:19 PM  Magda Kiel, [place] 12-12-2015

## 2015-11-12 NOTE — Clinical Social Work Placement (Signed)
   CLINICAL SOCIAL WORK PLACEMENT  NOTE  Date:  11/12/2015  Patient Details  Name: [name] MRN: [id number] Date of Birth: [birth year]  Clinical Social Work is seeking post-discharge placement for this patient at the [place] level of care (*CSW will initial, date and re-position this form in  chart as items are completed):  Yes   Patient/family provided with [place] Work Department's list of facilities offering this level of care within the geographic area requested by the patient (or if unable, by the patient's family).  Yes   Patient/family informed of their freedom to choose among providers that offer the needed level of care, that participate in Medicare, Medicaid or managed care program needed by the patient, have an available bed and are willing to accept the patient.  Yes   Patient/family informed of [facility]'s ownership interest in [facility] and [facility], as well as of the fact that they are under no obligation to receive care at these facilities.  PASRR submitted to EDS on       PASRR number received on       Existing PASRR number confirmed on 11/12/15     FL2 transmitted to all facilities in geographic area requested by pt/family on 11/12/15     FL2 transmitted to all facilities within larger geographic area on       Patient informed that his/her managed care company has contracts with or will negotiate with certain facilities, including the following:            Patient/family informed of bed offers received.  Patient chooses bed at       Physician recommends and patient chooses bed at      Patient to be transferred to   on  .  Patient to be transferred to facility by       Patient family notified on   of transfer.  Name of family member notified:        PHYSICIAN       Additional Comment:    _______________________________________________ Dulcy Fanny, LCSW 11/12/2015, 12:09 PM

## 2015-11-12 NOTE — Progress Notes (Signed)
SUBJECTIVE:  Patient is asleep but arouses. Denies pain or SOB.    PHYSICAL EXAM Filed Vitals:   11/12/15 0500 11/12/15 0600 11/12/15 0700 11/12/15 0708  BP: 157/73 152/80 173/55   Pulse: 84 77 86   Temp:    97.5 F (36.4 C)  TempSrc:    Axillary  Resp: 8 14 15    Height:      Weight:      SpO2: 100% 97% 96%    General:  Elderly thin WF in NAD Lungs:  Decreased breath sounds- diffusely. No rales or rhonchi. Heart:  Irregular, no gallop or murmur.  Abdomen:  Positive bowel sounds, nontender.  Extremities:  No edema Neuro:  Nonfocal  LABS: Lab Results  Component Value Date   TROPONINI 0.10* 11/09/2015   Results for orders placed or performed during the hospital encounter of 11/06/15 (from the past 24 hour(s))  Urinalysis, Routine w reflex microscopic (not at [facility])     Status: Abnormal   Collection Time: 11/11/15  8:38 AM  Result Value Ref Range   Color, Urine RED (A) YELLOW   APPearance TURBID (A) CLEAR   Specific Gravity, Urine 1.013 1.005 - 1.030   pH 6.5 5.0 - 8.0   Glucose, UA NEGATIVE NEGATIVE mg/dL   Hgb urine dipstick LARGE (A) NEGATIVE   Bilirubin Urine NEGATIVE NEGATIVE   Ketones, ur 15 (A) NEGATIVE mg/dL   Protein, ur 100 (A) NEGATIVE mg/dL   Nitrite NEGATIVE NEGATIVE   Leukocytes, UA SMALL (A) NEGATIVE  Urine microscopic-add on     Status: Abnormal   Collection Time: 11/11/15  8:38 AM  Result Value Ref Range   Squamous Epithelial / LPF 0-5 (A) NONE SEEN   WBC, UA TOO NUMEROUS TO COUNT 0 - 5 WBC/hpf   RBC / HPF FIELD OBSCURED BY RBC'S 0 - 5 RBC/hpf   Bacteria, UA FEW (A) NONE SEEN  Basic metabolic panel     Status: Abnormal   Collection Time: 11/11/15  9:25 AM  Result Value Ref Range   Sodium 136 135 - 145 mmol/L   Potassium 3.5 3.5 - 5.1 mmol/L   Chloride 99 (L) 101 - 111 mmol/L   CO2 23 22 - 32 mmol/L   Glucose, Bld 81 65 - 99 mg/dL   BUN 14 6 - 20 mg/dL   Creatinine, Ser 0.68 0.44 - 1.00 mg/dL   Calcium 8.4 (L) 8.9 - 10.3 mg/dL   GFR calc  non Af Amer >60 >60 mL/min   GFR calc Af Amer >60 >60 mL/min   Anion gap 14 5 - 15  CBC     Status: Abnormal   Collection Time: 11/12/15  2:53 AM  Result Value Ref Range   WBC 10.3 4.0 - 10.5 K/uL   RBC 5.19 (H) 3.87 - 5.11 MIL/uL   Hemoglobin 15.4 (H) 12.0 - 15.0 g/dL   HCT 45.4 36.0 - 46.0 %   MCV 87.5 78.0 - 100.0 fL   MCH 29.7 26.0 - 34.0 pg   MCHC 33.9 30.0 - 36.0 g/dL   RDW 15.2 11.5 - 15.5 %   Platelets 185 150 - 400 K/uL  Basic metabolic panel     Status: Abnormal   Collection Time: 11/12/15  2:53 AM  Result Value Ref Range   Sodium 134 (L) 135 - 145 mmol/L   Potassium 2.7 (LL) 3.5 - 5.1 mmol/L   Chloride 96 (L) 101 - 111 mmol/L   CO2 26 22 - 32 mmol/L  Glucose, Bld 71 65 - 99 mg/dL   BUN 14 6 - 20 mg/dL   Creatinine, Ser 0.75 0.44 - 1.00 mg/dL   Calcium 8.3 (L) 8.9 - 10.3 mg/dL   GFR calc non Af Amer >60 >60 mL/min   GFR calc Af Amer >60 >60 mL/min   Anion gap 12 5 - 15    Intake/Output Summary (Last 24 hours) at 11/12/15 [id number] Last data filed at 11/12/15 0645  Gross per 24 hour  Intake    670 ml  Output   1870 ml  Net  -1200 ml     Telemetry: Atrial fibrillation with controlled response   ASSESSMENT AND PLAN:  1. ATRIAL FIB:   Rate control improved.  Now on oral therapy with  metoprolol and dig.   Afib is of unknown duration.  Long term anticoagulation will be a significant problem  with history of  frequent falls, hematuria. Reports at least 10 falls in last 6 months.   Ms. MARZELLE ARABIA has a CHA2DS2 - VASc score of 5 with a risk of stroke of 6.7%.   She also has poor social support living alone. Will hold off on anticoagulation until we can assure the proper environment for safety. Will continue ASA 81 mg daily.  2. HTN- BP now elevated. Was hypotensive on admission. Will add ACEi and titrate as tolerated.  3. MR:  Moderate by echo this admission.  Follow clinically.    4. ELEVATED TROPONIN:  Suspect that this is related to rapid rate with Afib and  demand ischemia.  Mild elevation with flat trend.   I am not planning an invasive work up at this point.   5. Chronic systolic CHF with nonischemic cardiomyopathy:  Presumed non ischemic with EF now 20%.  Was 66 - 40% with nonobstructive CAD on cath in 2010.  On metoprolol, dig. Will add ACEi today.  6. PVD:  90% distal aortic narrowing noted by Dr. Claiborne Billings on cath 2010.    7. UTI. Klebsiella per primary team. Now with urinary retention.  8. Mental status changes. Negative CT head on 12/16. Improved with treatment of infection. Ammonia level and ABG OK.   9. Hematuria. Work up per urology.     Collier Salina [facility] 11/12/2015 7:18 AM

## 2015-11-12 NOTE — Progress Notes (Signed)
[facility] PROGRESS NOTE  TARNISHA FARRELL [id number] DOB: May 16, 1935 DOA: 11/06/2015 PCP: Purvis Kilts, MD  Assessment/Plan: 1. Urinary retention -[name] had guarding over her lower abdominal region on this mornings exam. Bladder felt distended. Bladder ultrasound was ordered and showed > 900 mL of urine. RN reporting she has been incontinence unclear what her urinary output has been. -Will check a urinalysis as well as a repeat BMP -Foley catheter placement ordered -May have been precipitated by decreased mobility, drugs, infection?  -Urology was consulted recommending keeping the Foley catheter for at least one week -CT scan of abdomen and pelvis performed on 11/12/2015 showing bilateral nonobstructive nephrolithiasis there is no evidence of ureteral calculi or hydronephrosis. There is no evidence of malignancy.   2.  Suspected delirium -Veronica. Lovering initially admitted for syncope in setting of A. fib. Hospitalization complicated by mental status changes likely related to delirium. -She was started on IV antibiotic therapy with ceftriaxone with suspicion of underlying infectious process precipitating delirium. -Evaluation on 11/12/2015 she showed improvement, more awake, alert, conversive. -Encourage mobility, physical therapy consulted.   3.  Atrial fibrillation with rapid ventricular response. -Telemetry showing ventricular rates of 80s to 90s this morning. -Cardiology treating with digoxin 0.625 g by mouth daily and metoprolol 25 mg by mouth 4 times daily.  4.  Urinary tract infection. -Patient is being treated with ceftriaxone.   5. Hypokalemia  -AM labs showing potassium 2.7 will replace with PO and IV potassium  -Will repeat BMP in am  Code Status: Full code Family Communication: Family is not present Disposition Plan:  Will transfer out of SDU to floor.    Antibiotics:  Ceftriaxone  HPI/Subjective: She is more awake and alert, seems  improved  Objective: Filed Vitals:   11/12/15 1142 11/12/15 1349  BP: 124/54 171/76  Pulse: 90 78  Temp:  97.8 F (36.6 C)  Resp: 17 17    Intake/Output Summary (Last 24 hours) at 11/12/15 1500 Last data filed at 11/12/15 0645  Gross per 24 hour  Intake    560 ml  Output    445 ml  Net    115 ml   Filed Weights   11/07/15 0511 11/07/15 1753 11/10/15 0200  Weight: 59.6 kg (131 lb 6.3 oz) 59.5 kg (131 lb 2.8 oz) 55.792 kg (123 lb)    Exam:   General:  She is awake and alert, following commands  Cardiovascular: Irregular rate and rhythm normal S1-S2 no extremity edema  Respiratory: Normal respiratory effort, lungs are clear to auscultation bilaterally  Abdomen: Significant improvement to abdominal exam, now is soft and nontender  Musculoskeletal: No edema  Data Reviewed: Basic Metabolic Panel:  Recent Labs Lab 11/08/15 0027 11/09/15 0014 11/09/15 0302 11/11/15 0925 11/12/15 0253  NA 136 134* 135 136 134*  K 3.6 3.6 3.7 3.5 2.7*  CL 108 107 105 99* 96*  CO2 19* 18* 20* 23 26  GLUCOSE 123* 123* 144* 81 71  BUN 21* 23* 23* 14 14  CREATININE 0.95 1.02* 0.98 0.68 0.75  CALCIUM 8.4* 8.6* 8.6* 8.4* 8.3*   Liver Function Tests:  Recent Labs Lab 11/10/15 0820  AST 21  ALT 18  ALKPHOS 72  BILITOT 1.3*  PROT 5.0*  ALBUMIN 2.8*   No results for input(s): LIPASE, AMYLASE in the last 168 hours.  Recent Labs Lab 11/09/15 1530  AMMONIA 25   CBC:  Recent Labs Lab 11/09/15 0014 11/09/15 0302 11/10/15 0258 11/11/15 0419 11/12/15 0253  WBC 7.7  8.8 10.1 10.3 10.3  NEUTROABS 5.7  --   --   --   --   HGB 12.5 13.6 14.6 14.3 15.4*  HCT 38.7 41.2 43.0 43.5 45.4  MCV 90.4 90.0 87.8 88.2 87.5  PLT 217 241 172 226 185   Cardiac Enzymes:  Recent Labs Lab 11/06/15 1932 11/07/15 0048 11/07/15 0701 11/07/15 1307 11/09/15 0014  TROPONINI 0.05* 0.05* 0.04* 0.10* 0.10*   BNP (last 3 results)  Recent Labs  11/09/15 0014  BNP 1757.7*    ProBNP  (last 3 results) No results for input(s): PROBNP in the last 8760 hours.  CBG:  Recent Labs Lab 11/06/15 1022  GLUCAP 88    Recent Results (from the past 240 hour(s))  Urine culture     Status: None   Collection Time: 11/06/15 11:47 AM  Result Value Ref Range Status   Specimen Description URINE, CATHETERIZED  Final   Special Requests NONE  Final   Culture   Final    >=100,000 COLONIES/mL KLEBSIELLA OXYTOCA Performed at [facility]    Report Status 11/09/2015 FINAL  Final   Organism ID, Bacteria KLEBSIELLA OXYTOCA  Final      Susceptibility   Klebsiella oxytoca - MIC*    AMPICILLIN >=32 RESISTANT Resistant     CEFAZOLIN 32 INTERMEDIATE Intermediate     CEFTRIAXONE <=1 SENSITIVE Sensitive     CIPROFLOXACIN <=0.25 SENSITIVE Sensitive     GENTAMICIN <=1 SENSITIVE Sensitive     IMIPENEM <=0.25 SENSITIVE Sensitive     NITROFURANTOIN 32 SENSITIVE Sensitive     TRIMETH/SULFA <=20 SENSITIVE Sensitive     AMPICILLIN/SULBACTAM 16 INTERMEDIATE Intermediate     PIP/TAZO <=4 SENSITIVE Sensitive     * >=100,000 COLONIES/mL KLEBSIELLA OXYTOCA  MRSA PCR Screening     Status: None   Collection Time: 11/07/15  6:22 PM  Result Value Ref Range Status   MRSA by PCR NEGATIVE NEGATIVE Final    Comment:        The GeneXpert MRSA Assay (FDA approved for NASAL specimens only), is one component of a comprehensive MRSA colonization surveillance program. It is not intended to diagnose MRSA infection nor to guide or monitor treatment for MRSA infections.   Culture, blood (routine x 2)     Status: None (Preliminary result)   Collection Time: 11/09/15  3:30 PM  Result Value Ref Range Status   Specimen Description BLOOD LEFT HAND  Final   Special Requests IN PEDIATRIC BOTTLE 1CC  Final   Culture NO GROWTH 3 DAYS  Final   Report Status PENDING  Incomplete  Culture, blood (routine x 2)     Status: None (Preliminary result)   Collection Time: 11/09/15  3:40 PM  Result Value Ref Range  Status   Specimen Description BLOOD LEFT HAND  Final   Special Requests IN PEDIATRIC BOTTLE 1.5CC  Final   Culture NO GROWTH 3 DAYS  Final   Report Status PENDING  Incomplete  C difficile quick scan w PCR reflex     Status: None   Collection Time: 11/09/15  8:32 PM  Result Value Ref Range Status   C Diff antigen NEGATIVE NEGATIVE Final   C Diff toxin NEGATIVE NEGATIVE Final   C Diff interpretation Negative for toxigenic C. difficile  Final  Culture, Urine     Status: None   Collection Time: 11/10/15  6:24 AM  Result Value Ref Range Status   Specimen Description URINE, RANDOM  Final   Special Requests NONE  Final   Culture NO GROWTH 1 DAY  Final   Report Status 11/11/2015 FINAL  Final     Studies: Ct Abdomen Pelvis W Wo Contrast  11/12/2015  CLINICAL DATA:  Gross hematuria. Urinary tract infections and urinary retention. EXAM: CT ABDOMEN AND PELVIS WITHOUT AND WITH CONTRAST TECHNIQUE: Multidetector CT imaging of the abdomen and pelvis was performed following the standard protocol before and following the bolus administration of intravenous contrast. CONTRAST:  125 mL Omnipaque 300 COMPARISON:  None. FINDINGS: Lower chest: Small bilateral pleural effusions and bibasilar atelectasis. Hepatobiliary: Mild capsular nodularity and enlargement of left hepatic lobe is suspicious for cirrhosis. No liver masses are identified. Gallbladder is unremarkable. Pancreas: No mass, inflammatory changes, or other significant abnormality. Spleen: Within normal limits in size and appearance. Adrenals/Urinary Tract: Small low-attenuation bilateral adrenal adenomas are noted. Small renal calculi seen bilaterally, however there is no evidence of ureteral calculi or hydronephrosis. Bilateral renal parenchymal scarring is noted, right side greater than left. No evidence of renal masses. No masses seen involving the lower urinary tract. Foley catheter is seen within the urinary bladder which is nearly completely empty.  Stomach/Bowel: No evidence of obstruction, inflammatory process, or abnormal fluid collections. Diverticulosis seen predominately involving the sigmoid colon. No evidence of diverticulitis. Vascular/Lymphatic: No pathologically enlarged lymph nodes. No evidence of abdominal aortic aneurysm. Reproductive: Prior hysterectomy noted. Adnexal regions are unremarkable in appearance. Other: Small amount of free fluid seen within the pelvis as well as diffuse mesenteric and body wall edema. Musculoskeletal: No suspicious bone lesions identified. Right hip prosthesis results in artifact through the lower pelvis. Old T12 and L1 vertebral body compression fracture deformities again noted. IMPRESSION: Bilateral nonobstructive nephrolithiasis and renal parenchymal scarring. No evidence of ureteral calculi or hydronephrosis. No evidence of urinary tract carcinoma. Bilateral benign adrenal adenomas. Suspect hepatic cirrhosis. Mild ascites, small bilateral pleural effusions, and diffuse body wall edema also noted. Colonic diverticulosis. No radiographic evidence of diverticulitis. Electronically Signed   By: Earle Gell M.D.   On: 11/12/2015 11:31    Scheduled Meds: . aspirin EC  81 mg Oral Daily  . cefTRIAXone (ROCEPHIN)  IV  1 g Intravenous Q24H  . digoxin  0.0625 mg Oral Daily  . heparin  5,000 Units Subcutaneous 3 times per day  . lisinopril  5 mg Oral Daily  . metoprolol tartrate  50 mg Oral BID  . potassium chloride  40 mEq Oral Q6H   Continuous Infusions:   Principal Problem:   Syncope Active Problems:   Dehydration   Atrial fibrillation with RVR (HCC)   UTI (lower urinary tract infection)   ARF (acute renal failure) (HCC)   Leukocytosis   VT (ventricular tachycardia) (HCC)   Acute encephalopathy   Physical deconditioning   Abdominal pain   Atrial fibrillation with rapid ventricular response (HCC)   Pressure ulcer   Chronic systolic CHF (congestive heart failure) ([place])    Time spent: 25  minutes    Kelvin Cellar  [facility] Pager [phone]. If 7PM-7AM, please contact night-coverage at [email or web address], password [facility] 11/12/2015, 3:00 PM  LOS: 6 days

## 2015-11-13 LAB — BASIC METABOLIC PANEL
ANION GAP: 11 (ref 5–15)
BUN: 13 mg/dL (ref 6–20)
CHLORIDE: 100 mmol/L — AB (ref 101–111)
CO2: 24 mmol/L (ref 22–32)
Calcium: 8.6 mg/dL — ABNORMAL LOW (ref 8.9–10.3)
Creatinine, Ser: 0.76 mg/dL (ref 0.44–1.00)
GFR calc non Af Amer: >60 mL/min (ref >60)
GLUCOSE: 70 mg/dL (ref 65–99)
POTASSIUM: 3.2 mmol/L — AB (ref 3.5–5.1)
Sodium: 135 mmol/L (ref 135–145)

## 2015-11-13 LAB — CBC
HEMATOCRIT: 43.3 % (ref 36.0–46.0)
HEMOGLOBIN: 14.3 g/dL (ref 12.0–15.0)
MCH: 29.4 pg (ref 26.0–34.0)
MCHC: 33 g/dL (ref 30.0–36.0)
MCV: 89.1 fL (ref 78.0–100.0)
Platelets: 241 10*3/uL (ref 150–400)
RBC: 4.86 MIL/uL (ref 3.87–5.11)
RDW: 15.3 % (ref 11.5–15.5)
WBC: 9.8 10*3/uL (ref 4.0–10.5)

## 2015-11-13 MED ORDER — DIGOXIN 62.5 MCG PO TABS: 0.0625 mg | ORAL_TABLET | Freq: Every day | ORAL | Status: DC

## 2015-11-13 MED ORDER — LISINOPRIL 5 MG PO TABS: 5.0000 mg | ORAL_TABLET | Freq: Every day | ORAL | Status: AC

## 2015-11-13 MED ORDER — POTASSIUM CHLORIDE CRYS ER 20 MEQ PO TBCR
40.0000 meq | EXTENDED_RELEASE_TABLET | Freq: Four times a day (QID) | ORAL | Status: DC
  Administered 2015-11-13: 40 meq via ORAL
  Filled 2015-11-13: qty 2

## 2015-11-13 MED ORDER — ASPIRIN 81 MG PO TBEC: 81.0000 mg | DELAYED_RELEASE_TABLET | Freq: Every day | ORAL | Status: AC

## 2015-11-13 MED ORDER — METOPROLOL TARTRATE 50 MG PO TABS: 50.0000 mg | ORAL_TABLET | Freq: Two times a day (BID) | ORAL | Status: AC

## 2015-11-13 NOTE — Progress Notes (Signed)
CSW informed that pt ready for DC today- unable to get [name] today- pt will DC to [facility] on LOG- pt brother agreeable to placement under LOG  Patient will discharge to [facility] Anticipated discharge date:11/13/15 Family notified: pt brother- Mr. Mitzie Na by Corey Harold- scheduled for 12pm  CSW signing off.  Domenica Reamer, [place] Social Worker [phone]

## 2015-11-13 NOTE — Progress Notes (Signed)
[facility] PROGRESS NOTE  [name] [id number] DOB: 1935-06-01 DOA: 11/06/2015 PCP: Purvis Kilts, MD  Assessment/Plan: 1. Urinary retention -On 11/11/2015 [name] had guarding over her lower abdominal region. Bladder felt distended. Bladder ultrasound was ordered and showed > 900 mL of urine. RN reporting she has been incontinence unclear what her urinary output has been. -Will check a urinalysis as well as a repeat BMP -Foley catheter placement ordered -May have been precipitated by decreased mobility, drugs, infection?  -Urology was consulted recommending keeping the Foley catheter for at least one week -CT scan of abdomen and pelvis performed on 11/12/2015 showing bilateral nonobstructive nephrolithiasis there is no evidence of ureteral calculi or hydronephrosis. There is no evidence of malignancy.   2.  Suspected delirium -Veronica. Brady initially admitted for syncope in setting of A. fib. Hospitalization complicated by mental status changes likely related to delirium, as her mental status has waxed and waned during this hospitalization. -She was treated with IV antibiotic therapy with ceftriaxone with suspicion of underlying infectious process precipitating delirium, receiving a total of 5 days -Encourage mobility, physical therapy consulted.   3.  Atrial fibrillation with rapid ventricular response. -Telemetry showing ventricular rates of 80s to 90s this morning. -Cardiology treating with digoxin 0.625 g by mouth daily and metoprolol 25 mg by mouth 4 times daily.  4.  Urinary tract infection. -Status post treatment with 5 days of IV ceftriaxone -She remains afebrile, ceftriaxone stopped on 11/13/2015.  5. Hypokalemia  -On 11/12/2015 AM labs showing potassium 2.7 will replace with PO and IV potassium  -Repeat labs on 11/13/2015 showing potassium of 2.3. Will give additional replacement with Kdur  Code Status: Full code Family Communication: Family is  not present Disposition Plan:  Awaiting placement to skilled nursing facility   Antibiotics:  Ceftriaxone  HPI/Subjective: Patient is a 80 year old female who initially was admitted to [facility]  on 11/06/2015 after presenting with a syncopal event. She was recently started on ciprofloxacin for urinary tract infection. She was found to be in new onset atrial fibrillation. Cardiology was consulted. A transthoracic echocardiogram performed on 11/07/2015 revealed EF of 20% with severe diffuse hypokinesis. On telemetry she was found to have an episode of 30 beats of nonsustained ventricular tachycardia. Cardiology recommended transfer to [facility] for additional evaluation and management. Her A. fib with RVR was controlled with beta blocker therapy along with digoxin. Cardiology did not recommend noninvasive workup for troponin elevation. With regard to cardiomyopathy she had a cardiac catheterization in 2010 showed nonobstructive coronary artery disease. Cardiology recommended maximizing medical management. Her hospitalization was complicated by the development of delirium along with urinary retention. Her mental status changes have waxed and waned over the course of her hospitalization. It was felt that urinary tract infection may have contributed along with hospitalization. She was given 5 days of IV ceftriaxone. With very to urinary retention a Foley catheter was placed as urology was consulted. She was found to have hematuria. Dr. Louis Meckel recommended keeping her Foley catheter for 1 week then follow-up at the office. CT scan of abdomen and pelvis showing bilateral nonobstructive nephrolithiasis there is no evidence of ureteral calculi or hydronephrosis. There is no evidence of malignancy. By 11/13/2015 her urine had cleared up. Plan to discharge to skilled nursing facility for acute rehabilitation.  Objective: Filed Vitals:   11/12/15 2123 11/13/15 0510  BP: 137/52 153/72  Pulse:  78 73  Temp: 98.3 F (36.8 C) 97.5 F (36.4 C)  Resp:  18 16    Intake/Output Summary (Last 24 hours) at 11/13/15 0834 Last data filed at 11/12/15 2100  Gross per 24 hour  Intake      0 ml  Output    200 ml  Net   -200 ml   Filed Weights   11/07/15 0511 11/07/15 1753 11/10/15 0200  Weight: 59.6 kg (131 lb 6.3 oz) 59.5 kg (131 lb 2.8 oz) 55.792 kg (123 lb)    Exam:   General:  She is awake and alert, following commands  Cardiovascular: Irregular rate and rhythm normal S1-S2 no extremity edema  Respiratory: Normal respiratory effort, lungs are clear to auscultation bilaterally  Abdomen: Significant improvement to abdominal exam, now is soft and nontender  Musculoskeletal: No edema  Data Reviewed: Basic Metabolic Panel:  Recent Labs Lab 11/09/15 0014 11/09/15 0302 11/11/15 0925 11/12/15 0253 11/13/15 0355  NA 134* 135 136 134* 135  K 3.6 3.7 3.5 2.7* 3.2*  CL 107 105 99* 96* 100*  CO2 18* 20* 23 26 24   GLUCOSE 123* 144* 81 71 70  BUN 23* 23* 14 14 13   CREATININE 1.02* 0.98 0.68 0.75 0.76  CALCIUM 8.6* 8.6* 8.4* 8.3* 8.6*   Liver Function Tests:  Recent Labs Lab 11/10/15 0820  AST 21  ALT 18  ALKPHOS 72  BILITOT 1.3*  PROT 5.0*  ALBUMIN 2.8*   No results for input(s): LIPASE, AMYLASE in the last 168 hours.  Recent Labs Lab 11/09/15 1530  AMMONIA 25   CBC:  Recent Labs Lab 11/09/15 0014 11/09/15 0302 11/10/15 0258 11/11/15 0419 11/12/15 0253 11/13/15 0355  WBC 7.7 8.8 10.1 10.3 10.3 9.8  NEUTROABS 5.7  --   --   --   --   --   HGB 12.5 13.6 14.6 14.3 15.4* 14.3  HCT 38.7 41.2 43.0 43.5 45.4 43.3  MCV 90.4 90.0 87.8 88.2 87.5 89.1  PLT 217 241 172 226 185 241   Cardiac Enzymes:  Recent Labs Lab 11/06/15 1932 11/07/15 0048 11/07/15 0701 11/07/15 1307 11/09/15 0014  TROPONINI 0.05* 0.05* 0.04* 0.10* 0.10*   BNP (last 3 results)  Recent Labs  11/09/15 0014  BNP 1757.7*    ProBNP (last 3 results) No results for input(s):  PROBNP in the last 8760 hours.  CBG:  Recent Labs Lab 11/06/15 1022  GLUCAP 88    Recent Results (from the past 240 hour(s))  Urine culture     Status: None   Collection Time: 11/06/15 11:47 AM  Result Value Ref Range Status   Specimen Description URINE, CATHETERIZED  Final   Special Requests NONE  Final   Culture   Final    >=100,000 COLONIES/mL KLEBSIELLA OXYTOCA Performed at [facility]    Report Status 11/09/2015 FINAL  Final   Organism ID, Bacteria KLEBSIELLA OXYTOCA  Final      Susceptibility   Klebsiella oxytoca - MIC*    AMPICILLIN >=32 RESISTANT Resistant     CEFAZOLIN 32 INTERMEDIATE Intermediate     CEFTRIAXONE <=1 SENSITIVE Sensitive     CIPROFLOXACIN <=0.25 SENSITIVE Sensitive     GENTAMICIN <=1 SENSITIVE Sensitive     IMIPENEM <=0.25 SENSITIVE Sensitive     NITROFURANTOIN 32 SENSITIVE Sensitive     TRIMETH/SULFA <=20 SENSITIVE Sensitive     AMPICILLIN/SULBACTAM 16 INTERMEDIATE Intermediate     PIP/TAZO <=4 SENSITIVE Sensitive     * >=100,000 COLONIES/mL KLEBSIELLA OXYTOCA  MRSA PCR Screening     Status: None   Collection Time: 11/07/15  6:22 PM  Result Value Ref Range Status   MRSA by PCR NEGATIVE NEGATIVE Final    Comment:        The GeneXpert MRSA Assay (FDA approved for NASAL specimens only), is one component of a comprehensive MRSA colonization surveillance program. It is not intended to diagnose MRSA infection nor to guide or monitor treatment for MRSA infections.   Culture, blood (routine x 2)     Status: None (Preliminary result)   Collection Time: 11/09/15  3:30 PM  Result Value Ref Range Status   Specimen Description BLOOD LEFT HAND  Final   Special Requests IN PEDIATRIC BOTTLE 1CC  Final   Culture NO GROWTH 3 DAYS  Final   Report Status PENDING  Incomplete  Culture, blood (routine x 2)     Status: None (Preliminary result)   Collection Time: 11/09/15  3:40 PM  Result Value Ref Range Status   Specimen Description BLOOD LEFT  HAND  Final   Special Requests IN PEDIATRIC BOTTLE 1.5CC  Final   Culture NO GROWTH 3 DAYS  Final   Report Status PENDING  Incomplete  C difficile quick scan w PCR reflex     Status: None   Collection Time: 11/09/15  8:32 PM  Result Value Ref Range Status   C Diff antigen NEGATIVE NEGATIVE Final   C Diff toxin NEGATIVE NEGATIVE Final   C Diff interpretation Negative for toxigenic C. difficile  Final  Culture, Urine     Status: None   Collection Time: 11/10/15  6:24 AM  Result Value Ref Range Status   Specimen Description URINE, RANDOM  Final   Special Requests NONE  Final   Culture NO GROWTH 1 DAY  Final   Report Status 11/11/2015 FINAL  Final     Studies: Ct Abdomen Pelvis W Wo Contrast  11/12/2015  CLINICAL DATA:  Gross hematuria. Urinary tract infections and urinary retention. EXAM: CT ABDOMEN AND PELVIS WITHOUT AND WITH CONTRAST TECHNIQUE: Multidetector CT imaging of the abdomen and pelvis was performed following the standard protocol before and following the bolus administration of intravenous contrast. CONTRAST:  125 mL Omnipaque 300 COMPARISON:  None. FINDINGS: Lower chest: Small bilateral pleural effusions and bibasilar atelectasis. Hepatobiliary: Mild capsular nodularity and enlargement of left hepatic lobe is suspicious for cirrhosis. No liver masses are identified. Gallbladder is unremarkable. Pancreas: No mass, inflammatory changes, or other significant abnormality. Spleen: Within normal limits in size and appearance. Adrenals/Urinary Tract: Small low-attenuation bilateral adrenal adenomas are noted. Small renal calculi seen bilaterally, however there is no evidence of ureteral calculi or hydronephrosis. Bilateral renal parenchymal scarring is noted, right side greater than left. No evidence of renal masses. No masses seen involving the lower urinary tract. Foley catheter is seen within the urinary bladder which is nearly completely empty. Stomach/Bowel: No evidence of obstruction,  inflammatory process, or abnormal fluid collections. Diverticulosis seen predominately involving the sigmoid colon. No evidence of diverticulitis. Vascular/Lymphatic: No pathologically enlarged lymph nodes. No evidence of abdominal aortic aneurysm. Reproductive: Prior hysterectomy noted. Adnexal regions are unremarkable in appearance. Other: Small amount of free fluid seen within the pelvis as well as diffuse mesenteric and body wall edema. Musculoskeletal: No suspicious bone lesions identified. Right hip prosthesis results in artifact through the lower pelvis. Old T12 and L1 vertebral body compression fracture deformities again noted. IMPRESSION: Bilateral nonobstructive nephrolithiasis and renal parenchymal scarring. No evidence of ureteral calculi or hydronephrosis. No evidence of urinary tract carcinoma. Bilateral benign adrenal adenomas. Suspect hepatic cirrhosis. Mild  ascites, small bilateral pleural effusions, and diffuse body wall edema also noted. Colonic diverticulosis. No radiographic evidence of diverticulitis. Electronically Signed   By: Earle Gell M.D.   On: 11/12/2015 11:31    Scheduled Meds: . aspirin EC  81 mg Oral Daily  . cefTRIAXone (ROCEPHIN)  IV  1 g Intravenous Q24H  . digoxin  0.0625 mg Oral Daily  . heparin  5,000 Units Subcutaneous 3 times per day  . lisinopril  5 mg Oral Daily  . metoprolol tartrate  50 mg Oral BID  . potassium chloride  40 mEq Oral Q6H   Continuous Infusions:   Principal Problem:   Syncope Active Problems:   Dehydration   Atrial fibrillation with RVR (HCC)   UTI (lower urinary tract infection)   ARF (acute renal failure) (HCC)   Leukocytosis   VT (ventricular tachycardia) (HCC)   Acute encephalopathy   Physical deconditioning   Abdominal pain   Atrial fibrillation with rapid ventricular response (HCC)   Pressure ulcer   Chronic systolic CHF (congestive heart failure) ([place])   Urinary retention    Time spent: 25 minutes    Kelvin Cellar  [facility] Pager [phone]. If 7PM-7AM, please contact night-coverage at [email or web address], password [facility] 11/13/2015, 8:34 AM  LOS: 7 days

## 2015-11-13 NOTE — Discharge Summary (Addendum)
Physician Discharge Summary  [name] [id number] DOB: 1934/12/18 DOA: 11/06/2015  PCP: Purvis Kilts, MD  Admit date: 11/06/2015 Discharge date: 11/21/2015  Time spent: 35 minutes  Recommendations for Outpatient Follow-up:   1. Please continue Foley catheter until she is evaluated by Dr. Louis Meckel of urology. Follow-up appointment has been made. She had urinary retention during this hospitalization. 2. Please follow-up on blood pressures, she was started on metoprolol and lisinopril during this hospitalization 3. Follow-up on heart rates, she was treated for A. fib during this hospitalization   Discharge Diagnoses:  Principal Problem:   Syncope Active Problems:   Dehydration   Atrial fibrillation with RVR (HCC)   UTI (lower urinary tract infection)   ARF (acute renal failure) (HCC)   Leukocytosis   VT (ventricular tachycardia) (HCC)   Acute encephalopathy   Physical deconditioning   Abdominal pain   Atrial fibrillation with rapid ventricular response (HCC)   Chronic systolic CHF (congestive heart failure) ([place])   Urinary retention   Discharge Condition: Stable  Diet recommendation: Heart healthy  Filed Weights   11/07/15 0511 11/07/15 1753 11/10/15 0200  Weight: 59.6 kg (131 lb 6.3 oz) 59.5 kg (131 lb 2.8 oz) 55.792 kg (123 lb)    History of present illness:  Patient is a 80 year old woman without medical history who presents to the hospital today after a syncopal episode. She states she awoke this morning to go to the bathroom and fell to the floor. No one was around hence she does not know how long she was unconscious, she does relate hitting her head. Upon arrival to the emergency department she is found to have a UTI, sound to be in atrial fibrillation with rapid ventricular response with rates in the 120s to 130s which is new onset for her, she was also found to have leukocytosis and acute renal failure. We have been asked to admit her for further  evaluation and management.  Hospital Course:  [name] is a 80 year old female who initially was admitted to [facility] on 11/06/2015 after presenting with a syncopal event. She was recently started on ciprofloxacin for urinary tract infection. She was found to be in new onset atrial fibrillation. Cardiology was consulted. A transthoracic echocardiogram performed on 11/07/2015 revealed EF of 20% with severe diffuse hypokinesis. On telemetry she was found to have an episode of 30 beats of nonsustained ventricular tachycardia. Cardiology recommended transfer to [facility] for additional evaluation and management. Her A. fib with RVR was controlled with beta blocker therapy along with digoxin. Cardiology did not recommend noninvasive workup for troponin elevation. With regard to cardiomyopathy she had a cardiac catheterization in 2010 showed nonobstructive coronary artery disease. Cardiology recommended maximizing medical management. Her hospitalization was complicated by the development of delirium along with urinary retention. Her mental status changes have waxed and waned over the course of her hospitalization. It was felt that urinary tract infection may have contributed along with hospitalization. She was given 5 days of IV ceftriaxone. With very to urinary retention a Foley catheter was placed as urology was consulted. She was found to have hematuria. Dr. Louis Meckel recommended keeping her Foley catheter for 1 week then follow-up at the office. CT scan of abdomen and pelvis showing bilateral nonobstructive nephrolithiasis there is no evidence of ureteral calculi or hydronephrosis. There is no evidence of malignancy. By 11/13/2015 her urine had cleared up. He was discharged to skilled nursing facility for acute rehabilitation.  Procedures:  Transthoracic echocardiogram performed on 11/07/2015  impression: Left ventricle: The cavity size was normal. Systolic function was severely reduced.  The estimated ejection fraction was approximately 20%. Severe diffuse hypokinesis. The study was not technically sufficient to allow evaluation of LV diastolic dysfunction due to atrial fibrillation. Mild to moderate concentric left ventricular hypertrophy.  Consultations:  Cardiology  Discharge Exam: Filed Vitals:   11/13/15 0510 11/13/15 1000  BP: 153/72 149/52  Pulse: 73   Temp: 97.5 F (36.4 C)   Resp: 16      General: She assisted out of bed to chair, looks a little sleepy this morning  Cardiovascular: Irregular rate and rhythm normal S1-S2 no extremity edema  Respiratory: Normal respiratory effort, lungs are clear to auscultation bilaterally  Abdomen: Significant improvement to abdominal exam, now is soft and nontender  Musculoskeletal: No edema  Discharge Instructions   Discharge Instructions    Call MD for:  difficulty breathing, headache or visual disturbances    Complete by:  As directed      Call MD for:  extreme fatigue    Complete by:  As directed      Call MD for:  hives    Complete by:  As directed      Call MD for:  persistant dizziness or light-headedness    Complete by:  As directed      Call MD for:  persistant nausea and vomiting    Complete by:  As directed      Call MD for:  redness, tenderness, or signs of infection (pain, swelling, redness, odor or green/yellow discharge around incision site)    Complete by:  As directed      Call MD for:  severe uncontrolled pain    Complete by:  As directed      Call MD for:  temperature >100.4    Complete by:  As directed      Call MD for:    Complete by:  As directed      Diet - low sodium heart healthy    Complete by:  As directed      Increase activity slowly    Complete by:  As directed           Discharge Medication List as of 11/13/2015 12:50 PM    START taking these medications   Details  digoxin 62.5 MCG TABS Take 0.0625 mg by mouth daily., Starting 11/13/2015, Until  Discontinued, Normal    lisinopril (PRINIVIL,ZESTRIL) 5 MG tablet Take 1 tablet (5 mg total) by mouth daily., Starting 11/13/2015, Until Discontinued, Normal    metoprolol (LOPRESSOR) 50 MG tablet Take 1 tablet (50 mg total) by mouth 2 (two) times daily., Starting 11/13/2015, Until Discontinued, Print      CONTINUE these medications which have CHANGED   Details  aspirin EC 81 MG EC tablet Take 1 tablet (81 mg total) by mouth daily., Starting 11/13/2015, Until Discontinued, Normal      STOP taking these medications     ciprofloxacin (CIPRO) 500 MG tablet      naproxen sodium (ANAPROX) 220 MG tablet        Allergies  Allergen Reactions  . Penicillins    Follow-up Information    Follow up with Ardis Hughs, MD In 3 weeks.   Specialty:  Urology   Why:  for cystsocpy/ office will contact pt to schedule appointment    Contact information:   [place] [address] [phone]       Follow up with Sharilyn Sites  CABOT, MD In 2 weeks.   Specialty:  Family Medicine   Why:  left message to contact pt for app    Contact information:   [address] [place] [id number] [phone]       Follow up with Peter Martinique, MD On 11/30/2015.   Specialty:  Cardiology   Why:  pt app is on 11/30/15 @3 :00   Contact information:   [place] [address] [phone]       Follow up with [facility].   Specialty:  [place] information:   [address] [place] [phone]       The results of significant diagnostics from this hospitalization (including imaging, microbiology, ancillary and laboratory) are listed below for reference.    Significant Diagnostic Studies: Ct Abdomen Pelvis W Wo Contrast  11/12/2015  CLINICAL DATA:  Gross hematuria. Urinary tract infections and urinary retention. EXAM: CT ABDOMEN AND PELVIS WITHOUT AND WITH CONTRAST TECHNIQUE:  Multidetector CT imaging of the abdomen and pelvis was performed following the standard protocol before and following the bolus administration of intravenous contrast. CONTRAST:  125 mL Omnipaque 300 COMPARISON:  None. FINDINGS: Lower chest: Small bilateral pleural effusions and bibasilar atelectasis. Hepatobiliary: Mild capsular nodularity and enlargement of left hepatic lobe is suspicious for cirrhosis. No liver masses are identified. Gallbladder is unremarkable. Pancreas: No mass, inflammatory changes, or other significant abnormality. Spleen: Within normal limits in size and appearance. Adrenals/Urinary Tract: Small low-attenuation bilateral adrenal adenomas are noted. Small renal calculi seen bilaterally, however there is no evidence of ureteral calculi or hydronephrosis. Bilateral renal parenchymal scarring is noted, right side greater than left. No evidence of renal masses. No masses seen involving the lower urinary tract. Foley catheter is seen within the urinary bladder which is nearly completely empty. Stomach/Bowel: No evidence of obstruction, inflammatory process, or abnormal fluid collections. Diverticulosis seen predominately involving the sigmoid colon. No evidence of diverticulitis. Vascular/Lymphatic: No pathologically enlarged lymph nodes. No evidence of abdominal aortic aneurysm. Reproductive: Prior hysterectomy noted. Adnexal regions are unremarkable in appearance. Other: Small amount of free fluid seen within the pelvis as well as diffuse mesenteric and body wall edema. Musculoskeletal: No suspicious bone lesions identified. Right hip prosthesis results in artifact through the lower pelvis. Old T12 and L1 vertebral body compression fracture deformities again noted. IMPRESSION: Bilateral nonobstructive nephrolithiasis and renal parenchymal scarring. No evidence of ureteral calculi or hydronephrosis. No evidence of urinary tract carcinoma. Bilateral benign adrenal adenomas. Suspect hepatic  cirrhosis. Mild ascites, small bilateral pleural effusions, and diffuse body wall edema also noted. Colonic diverticulosis. No radiographic evidence of diverticulitis. Electronically Signed   By: Earle Gell M.D.   On: 11/12/2015 11:31   Dg Lumbar Spine 2-3 Views  11/10/2015  CLINICAL DATA:  Low back pain EXAM: LUMBAR SPINE - 2-3 VIEW COMPARISON:  Lumbar spine series of December 04, 2014 FINDINGS: Again demonstrated is approximately 50% anterior wedge compression of L1. There is no retropulsion of bone. There is mild disc space narrowing at T12-L1 and mild loss of height along the anterior inferior aspect of T12. The other lumbar vertebral bodies are preserved in height. The disc space heights are reasonably well-maintained. There is facet joint hypertrophy at L4-5 and L5-S1 which is stable. There is calcification in the wall of the abdominal aorta without evidence of aneurysm. IMPRESSION: Chronic 50% L1 anterior wedge compression. Mild stable 20% anterior compression of T12. Mild degenerative facet joint  change at L4-5 and L5-S1. No acute abnormality of the lumbar spine. Electronically Signed   By: David  Martinique M.D.   On: 11/10/2015 08:51   Ct Head Wo Contrast  11/06/2015  CLINICAL DATA:  Syncope with fall, hitting head EXAM: CT HEAD WITHOUT CONTRAST TECHNIQUE: Contiguous axial images were obtained from the base of the skull through the vertex without intravenous contrast. COMPARISON:  None. FINDINGS: There is moderate diffuse atrophy. There is no intracranial mass, hemorrhage, extra-axial fluid collection, or midline shift. There is moderate small vessel disease throughout the centra semiovale bilaterally. There is small vessel disease in the posterior limbs of each internal capsule. There is a small lacunar infarct in the mid pons region, midline. No acute infarct evident. Bony calvarium appears intact. The mastoid air cells are clear. There is opacification of several ethmoid sinus regions bilaterally.  No intraorbital lesions are identified. IMPRESSION: Atrophy with periventricular small vessel disease. Small vessel disease is also noted in each posterior limb internal capsule region. Prior small lacunar infarct mid pons region. No acute infarct evident. No hemorrhage or mass effect. There is patchy ethmoid sinus disease bilaterally. Electronically Signed   By: Lowella Grip III M.D.   On: 11/06/2015 13:31   Dg Chest Port 1 View  11/09/2015  CLINICAL DATA:  Respiratory distress EXAM: PORTABLE CHEST 1 VIEW COMPARISON:  Chest radiograph from one day prior. FINDINGS: Stable cardiomediastinal silhouette with mild cardiomegaly. No pneumothorax. Small bilateral pleural effusions not appreciably changed. Mild pulmonary edema, slightly worsened. Bibasilar atelectasis is stable. IMPRESSION: 1. Mild congestive heart failure, slightly worsened. 2. Stable small bilateral pleural effusions with bibasilar atelectasis. Electronically Signed   By: Ilona Sorrel M.D.   On: 11/09/2015 14:30   Dg Chest Port 1 View  11/09/2015  CLINICAL DATA:  80 year old female with shortness of breath EXAM: PORTABLE CHEST 1 VIEW COMPARISON:  Chest radiograph dated 11/06/2015 FINDINGS: Single-view of the chest demonstrate bibasilar airspace opacities, progressed compared to the prior study. There are small bilateral pleural effusions. There is stable cardiomegaly. No pneumothorax. The osseous structures are grossly unremarkable. IMPRESSION: Interval development of Small bilateral pleural effusions with bibasilar atelectatic changes versus pneumonia. Clinical correlation and follow-up recommended. Electronically Signed   By: Anner Crete M.D.   On: 11/09/2015 00:10   Dg Chest Portable 1 View  11/06/2015  CLINICAL DATA:  Syncope this morning with fall EXAM: PORTABLE CHEST 1 VIEW COMPARISON:  06/19/2013 FINDINGS: Bilateral airway thickening is noted with retrocardiac airspace opacity on the left and possibly on the right. Borderline  enlargement of the cardiopericardial silhouette. Mildly indistinct pulmonary vasculature. Atherosclerotic aortic arch.  No pneumothorax. IMPRESSION: 1. Mild bibasilar airspace opacities are suggested in the retrocardiac position, consider lateral radiography for confirmation. Atelectasis, pneumonia, or aspiration pneumonitis could cause this appearance. 2. Airway thickening is present, suggesting bronchitis or reactive airways disease. 3. Borderline enlargement of the cardiopericardial silhouette. 4. Atherosclerotic aortic arch. Electronically Signed   By: Van Clines M.D.   On: 11/06/2015 11:15   Dg Abd Portable 1v  11/09/2015  CLINICAL DATA:  Abdominal pain EXAM: PORTABLE ABDOMEN - 1 VIEW COMPARISON:  12/04/2014 FINDINGS: Nonobstructive bowel gas pattern. No free air organomegaly. Calcifications project over the kidneys bilaterally. These could be vascular or related to renal stones. Prior right hip replacement.  No acute bony abnormality. IMPRESSION: Possible bilateral renal stone versus vascular calcifications. No evidence of bowel obstruction. Electronically Signed   By: Rolm Baptise M.D.   On: 11/09/2015 14:28    Microbiology:  No results found for this or any previous visit (from the past 240 hour(s)).   Labs: Basic Metabolic Panel: No results for input(s): NA, K, CL, CO2, GLUCOSE, BUN, CREATININE, CALCIUM, MG, PHOS in the last 168 hours. Liver Function Tests: No results for input(s): AST, ALT, ALKPHOS, BILITOT, PROT, ALBUMIN in the last 168 hours. No results for input(s): LIPASE, AMYLASE in the last 168 hours. No results for input(s): AMMONIA in the last 168 hours. CBC: No results for input(s): WBC, NEUTROABS, HGB, HCT, MCV, PLT in the last 168 hours. Cardiac Enzymes: No results for input(s): CKTOTAL, CKMB, CKMBINDEX, TROPONINI in the last 168 hours. BNP: BNP (last 3 results)  Recent Labs  11/09/15 0014  BNP 1757.7*    ProBNP (last 3 results) No results for input(s):  PROBNP in the last 8760 hours.  CBG: No results for input(s): GLUCAP in the last 168 hours.     Signed:  Kelvin Cellar MD  FACP  [facility] 11/21/2015, 6:24 PM

## 2015-11-13 NOTE — Progress Notes (Signed)
Occupational Therapy Treatment Patient Details Name: [name] MRN: [id number] DOB: November 30, 1934 Today's Date: 11/13/2015    History of present illness Patient is a 80 year old woman without medical history who presents to the hospital today after a syncopal episode. She states she awoke this morning to go to the bathroom and fell to the floor. No one was around hence she does not know how long she was unconscious, she does relate hitting her head. Upon arrival to the emergency department she is found to have a UTI, sound to be in atrial fibrillation with rapid ventricular response with rates in the 120s to 130s which is new onset for her, she was also found to have leukocytosis and acute renal failure and AMS   OT comments  Patient making slow progress towards goals, will continue plan of care for now. Pt with confusion, unsure of patient's cognitive baseline. Pt perseverated on going home during this OT session, which limited her participation. Pt will benefit from SNF, she requires up to +2 assistance for functional transfers and will not be safe to go home alone at this time.   Follow Up Recommendations  SNF;Supervision/Assistance - 24 hour    Equipment Recommendations  Other (comment) (TBD next venue of care)    Recommendations for Other Services  None at this time  Precautions / Restrictions Precautions Precautions: Fall Restrictions Weight Bearing Restrictions: No    Mobility Bed Mobility General bed mobility comments: Pt found seated in recliner upon OT entering/exiting room  Transfers General transfer comment: Pt commented that she was starting to feel dizzy prior to therapist's attempt to stand with her therefore did not occur this session. Pt stated dizziness subsided quickly, therapist did assist patient with scooting back in chair.       ADL Overall ADL's : Needs assistance/impaired Eating/Feeding: Set up;Sitting Eating/Feeding Details (indicate cue type and  reason): encouragement to eat and initiate Grooming: Wash/dry face;Oral care;Sitting;Set up Grooming Details (indicate cue type and reason): encouragement for initiation, pt seated supported in recliner         Upper Body Dressing : Total assistance Upper Body Dressing Details (indicate cue type and reason): pt wearing gown   Lower Body Dressing Details (indicate cue type and reason): Pt refused to work on LB dressing    Cognition   Behavior During Therapy: [facility] for tasks assessed/performed Overall Cognitive Status: Impaired/Different from baseline Area of Impairment: Orientation;Attention;Memory;Following commands;Safety/judgement;Awareness Orientation Level: Disoriented to;Place;Time Current Attention Level: Selective Memory: Decreased short-term memory  Following Commands: Follows one step commands with increased time Safety/Judgement: Decreased awareness of safety;Decreased awareness of deficits Awareness: Intellectual   General Comments: Pt able to state it was December, but unable to state date. She did mention that Christmas was coming up. Pt peseverated on going home, "I'll clean up when I go home, I like to do it in the privacy of my own home". Even after therapist and RN discussing post acute rehab, pt continiously stated she planned to go home today.                  Pertinent Vitals/ Pain       Pain Assessment: No/denies pain   Frequency Min 2X/week     Progress Toward Goals  OT Goals(current goals can now befound in the care plan section)  Progress towards OT goals: Progressing toward goals     Plan Discharge plan remains appropriate    End of Session Equipment Utilized During Treatment: Gait belt   Activity Tolerance  Patient tolerated treatment well   Patient Left in chair;with call bell/phone within reach;with chair alarm set    Time: [id number] OT Time Calculation (min): 28 min  Charges: OT General Charges $OT Visit: 1 Procedure OT  Treatments $Self Care/Home Management : 23-37 mins  Chrys Racer , MS, OTR/L, CLT Pager: [phone]  11/13/2015, 10:15 AM

## 2015-11-13 NOTE — Progress Notes (Signed)
Patient Name: [name] Date of Encounter: 11/13/2015  Principal Problem:   Syncope Active Problems:   Dehydration   Atrial fibrillation with RVR (HCC)   UTI (lower urinary tract infection)   ARF (acute renal failure) (HCC)   Leukocytosis   VT (ventricular tachycardia) (HCC)   Acute encephalopathy   Physical deconditioning   Abdominal pain   Atrial fibrillation with rapid ventricular response (HCC)   Pressure ulcer   Chronic systolic CHF (congestive heart failure) ([place])   Urinary retention   Primary Cardiologist: Dr. Gwenlyn Found  SUBJECTIVE: Denies any chest pain, palpitations, or shortness of breath. Frustrated this morning due to being woken up early.  OBJECTIVE Filed Vitals:   11/12/15 1142 11/12/15 1349 11/12/15 2123 11/13/15 0510  BP: 124/54 171/76 137/52 153/72  Pulse: 90 78 78 73  Temp:  97.8 F (36.6 C) 98.3 F (36.8 C) 97.5 F (36.4 C)  TempSrc:  Oral Oral Oral  Resp: 17 17 18 16   Height:      Weight:      SpO2: 94% 100% 100% 97%    Intake/Output Summary (Last 24 hours) at 11/13/15 [id number] Last data filed at 11/12/15 2100  Gross per 24 hour  Intake      0 ml  Output    200 ml  Net   -200 ml   Filed Weights   11/07/15 0511 11/07/15 1753 11/10/15 0200  Weight: 131 lb 6.3 oz (59.6 kg) 131 lb 2.8 oz (59.5 kg) 123 lb (55.792 kg)    PHYSICAL EXAM General: Well developed, well nourished, female in no acute distress. Head: Normocephalic, atraumatic.  Neck: Supple without bruits, JVD not elevated. Lungs:  Resp regular and unlabored, Decreased breath sounds throughout. Heart: Irregularly irregular, S1, S2, no S3, S4, or murmur; no rub. Abdomen: Soft, non-tender, non-distended with normoactive bowel sounds. No hepatomegaly. No rebound/guarding. No obvious abdominal masses. Extremities: No clubbing, cyanosis, or edema. Distal pedal pulses are 2+ bilaterally. Neuro: Alert and oriented X 3. Moves all extremities spontaneously. Psych: Normal  affect.   LABS: CBC: Recent Labs  11/12/15 0253 11/13/15 0355  WBC 10.3 9.8  HGB 15.4* 14.3  HCT 45.4 43.3  MCV 87.5 89.1  PLT 185 241   INR:No results for input(s): INR in the last 72 hours. Basic Metabolic Panel: Recent Labs  11/12/15 0253 11/13/15 0355  NA 134* 135  K 2.7* 3.2*  CL 96* 100*  CO2 26 24  GLUCOSE 71 70  BUN 14 13  CREATININE 0.75 0.76  CALCIUM 8.3* 8.6*   Liver Function Tests: Recent Labs  11/10/15 0820  AST 21  ALT 18  ALKPHOS 72  BILITOT 1.3*  PROT 5.0*  ALBUMIN 2.8*   BNP:  B NATRIURETIC PEPTIDE  Date/Time Value Ref Range Status  11/09/2015 12:14 AM 1757.7* 0.0 - 100.0 pg/mL Final    TELE: Atrial fibrillation, rates mostly in 80's - 90's. Peaking in 110's. Episodes of Ventricular Bigeminy.        ECHO: 11/07/2015 Study Conclusions - Left ventricle: The cavity size was normal. Systolic function was severely reduced. The estimated ejection fraction was approximately 20%. Severe diffuse hypokinesis. The study was not technically sufficient to allow evaluation of LV diastolic dysfunction due to atrial fibrillation. Mild to moderate concentric left ventricular hypertrophy. - Aortic valve: Mildly calcified annulus. - Mitral valve: Mildly thickened leaflets . There was moderate regurgitation. - Left atrium: The atrium was severely dilated. - Right ventricle: Systolic function was mildly reduced. - Right  atrium: The atrium was mildly dilated. - Tricuspid valve: There was mild regurgitation. - Pulmonary arteries: Systolic pressure was mildly increased. PASP 38 mmHg. - Inferior vena cava: The vessel was dilated. The respirophasic diameter changes were blunted (< 50%), consistent with elevated central venous pressure.  Radiology/Studies: Ct Abdomen Pelvis W Wo Contrast: 11/12/2015  CLINICAL DATA:  Gross hematuria. Urinary tract infections and urinary retention. EXAM: CT ABDOMEN AND PELVIS WITHOUT AND WITH CONTRAST  TECHNIQUE: Multidetector CT imaging of the abdomen and pelvis was performed following the standard protocol before and following the bolus administration of intravenous contrast. CONTRAST:  125 mL Omnipaque 300 COMPARISON:  None. FINDINGS: Lower chest: Small bilateral pleural effusions and bibasilar atelectasis. Hepatobiliary: Mild capsular nodularity and enlargement of left hepatic lobe is suspicious for cirrhosis. No liver masses are identified. Gallbladder is unremarkable. Pancreas: No mass, inflammatory changes, or other significant abnormality. Spleen: Within normal limits in size and appearance. Adrenals/Urinary Tract: Small low-attenuation bilateral adrenal adenomas are noted. Small renal calculi seen bilaterally, however there is no evidence of ureteral calculi or hydronephrosis. Bilateral renal parenchymal scarring is noted, right side greater than left. No evidence of renal masses. No masses seen involving the lower urinary tract. Foley catheter is seen within the urinary bladder which is nearly completely empty. Stomach/Bowel: No evidence of obstruction, inflammatory process, or abnormal fluid collections. Diverticulosis seen predominately involving the sigmoid colon. No evidence of diverticulitis. Vascular/Lymphatic: No pathologically enlarged lymph nodes. No evidence of abdominal aortic aneurysm. Reproductive: Prior hysterectomy noted. Adnexal regions are unremarkable in appearance. Other: Small amount of free fluid seen within the pelvis as well as diffuse mesenteric and body wall edema. Musculoskeletal: No suspicious bone lesions identified. Right hip prosthesis results in artifact through the lower pelvis. Old T12 and L1 vertebral body compression fracture deformities again noted. IMPRESSION: Bilateral nonobstructive nephrolithiasis and renal parenchymal scarring. No evidence of ureteral calculi or hydronephrosis. No evidence of urinary tract carcinoma. Bilateral benign adrenal adenomas. Suspect  hepatic cirrhosis. Mild ascites, small bilateral pleural effusions, and diffuse body wall edema also noted. Colonic diverticulosis. No radiographic evidence of diverticulitis. Electronically Signed   By: Earle Gell M.D.   On: 11/12/2015 11:31     Current Medications:  . aspirin EC  81 mg Oral Daily  . cefTRIAXone (ROCEPHIN)  IV  1 g Intravenous Q24H  . digoxin  0.0625 mg Oral Daily  . heparin  5,000 Units Subcutaneous 3 times per day  . lisinopril  5 mg Oral Daily  . metoprolol tartrate  50 mg Oral BID  . potassium chloride  40 mEq Oral Q6H      ASSESSMENT AND PLAN:  1. New Onset Atrial Fibrillation - Rate control improved. Now on oral therapy with metoprolol and dig. - Long term anticoagulation will be a significant problemwith history of frequent falls, hematuria. Reports at least 10 falls in last 6 months. CHA2DS2 - VASc score of 5 with a risk of stroke of 6.7%. She also has poor social support living alone. Will hold off on anticoagulation until we can assure the proper environment for safety. Will continue ASA 81 mg daily.  2. HTN - BP has been 124/52 - 171/76 in the past 24 hours. - Consider increasing Lisinopril to 10mg  daily. Creatinine is stable at 0.76.  3. MR  - Moderate by echo this admission. Follow clinically.   4. ELEVATED TROPONIN - Suspect that this is related to rapid rate with Afib and demand ischemia. Peaked at 0.10 with flat trend. - no  plans for an invasive work up at this point.   5. Chronic systolic CHF with nonischemic cardiomyopathy  - Presumed non-ischemic with EF now 20%. Was 65 - 40% with nonobstructive CAD on cath in 2010.  - Continue Metoprolol, Dig, and ACE-I.  6. PVD - 90% distal aortic narrowing noted by Dr. Claiborne Billings on cath 2010.   7. UTI - Klebsiella per primary team. Now with urinary retention.  8. Mental status changes - Negative CT head on 12/16. Improved with treatment of infection. Ammonia level and ABG OK.   9.  Hematuria - per urology.  10. Hypokalemia - being replaced.  Arna Medici , PA-C 8:14 AM 11/13/2015 Pager: [phone] Patient seen and examined and history reviewed. Agree with above findings and plan. She is sitting in the chair and is the most alert I have seen. From a cardiac standpoint she is stable with good rate control and BP improved since starting lisinopril yesterday. Plans for NH placement. Will sign off now. Please call with questions.  Peter Martinique, [place] 11/13/2015 8:56 AM

## 2015-11-14 LAB — CULTURE, BLOOD (ROUTINE X 2)
CULTURE: NO GROWTH
Culture: NO GROWTH

## 2015-11-20 ENCOUNTER — Ambulatory Visit: Payer: Medicare HMO | Admitting: Urology

## 2015-11-30 ENCOUNTER — Ambulatory Visit: Payer: Medicare HMO | Admitting: Physician Assistant

## 2015-12-15 ENCOUNTER — Ambulatory Visit: Payer: Medicare HMO | Admitting: Adult Health

## 2015-12-17 ENCOUNTER — Ambulatory Visit: Payer: Medicare HMO | Admitting: Adult Health

## 2015-12-19 ENCOUNTER — Encounter (HOSPITAL_COMMUNITY): Payer: Self-pay | Admitting: Emergency Medicine

## 2015-12-19 ENCOUNTER — Observation Stay (HOSPITAL_COMMUNITY)
Admission: EM | Admit: 2015-12-19 | Discharge: 2015-12-21 | Disposition: A | Discharge status: Home / Self Care | Payer: PPO | Attending: Internal Medicine | Admitting: Internal Medicine

## 2015-12-19 ENCOUNTER — Emergency Department (HOSPITAL_COMMUNITY): Payer: PPO

## 2015-12-19 DIAGNOSIS — Y998 Other external cause status: Secondary | ICD-10-CM | POA: Insufficient documentation

## 2015-12-19 DIAGNOSIS — Z880 Allergy status to penicillin: Secondary | ICD-10-CM | POA: Insufficient documentation

## 2015-12-19 DIAGNOSIS — W01198A Fall on same level from slipping, tripping and stumbling with subsequent striking against other object, initial encounter: Secondary | ICD-10-CM | POA: Insufficient documentation

## 2015-12-19 DIAGNOSIS — Y9289 Other specified places as the place of occurrence of the external cause: Secondary | ICD-10-CM | POA: Insufficient documentation

## 2015-12-19 DIAGNOSIS — Z7982 Long term (current) use of aspirin: Secondary | ICD-10-CM | POA: Diagnosis not present

## 2015-12-19 DIAGNOSIS — R296 Repeated falls: Secondary | ICD-10-CM | POA: Diagnosis not present

## 2015-12-19 DIAGNOSIS — I5022 Chronic systolic (congestive) heart failure: Secondary | ICD-10-CM | POA: Diagnosis not present

## 2015-12-19 DIAGNOSIS — F17210 Nicotine dependence, cigarettes, uncomplicated: Secondary | ICD-10-CM | POA: Insufficient documentation

## 2015-12-19 DIAGNOSIS — R5381 Other malaise: Secondary | ICD-10-CM | POA: Insufficient documentation

## 2015-12-19 DIAGNOSIS — Z859 Personal history of malignant neoplasm, unspecified: Secondary | ICD-10-CM | POA: Diagnosis not present

## 2015-12-19 DIAGNOSIS — I429 Cardiomyopathy, unspecified: Secondary | ICD-10-CM | POA: Insufficient documentation

## 2015-12-19 DIAGNOSIS — I4891 Unspecified atrial fibrillation: Secondary | ICD-10-CM | POA: Diagnosis not present

## 2015-12-19 DIAGNOSIS — R339 Retention of urine, unspecified: Secondary | ICD-10-CM | POA: Diagnosis present

## 2015-12-19 DIAGNOSIS — Z9889 Other specified postprocedural states: Secondary | ICD-10-CM | POA: Insufficient documentation

## 2015-12-19 DIAGNOSIS — I2510 Atherosclerotic heart disease of native coronary artery without angina pectoris: Secondary | ICD-10-CM | POA: Diagnosis not present

## 2015-12-19 DIAGNOSIS — Z608 Other problems related to social environment: Secondary | ICD-10-CM | POA: Insufficient documentation

## 2015-12-19 DIAGNOSIS — Y9389 Activity, other specified: Secondary | ICD-10-CM | POA: Insufficient documentation

## 2015-12-19 DIAGNOSIS — M1990 Unspecified osteoarthritis, unspecified site: Secondary | ICD-10-CM | POA: Diagnosis not present

## 2015-12-19 DIAGNOSIS — Z043 Encounter for examination and observation following other accident: Secondary | ICD-10-CM | POA: Diagnosis present

## 2015-12-19 DIAGNOSIS — Z79899 Other long term (current) drug therapy: Secondary | ICD-10-CM | POA: Diagnosis not present

## 2015-12-19 DIAGNOSIS — Z609 Problem related to social environment, unspecified: Secondary | ICD-10-CM

## 2015-12-19 LAB — URINE MICROSCOPIC-ADD ON

## 2015-12-19 LAB — DIGOXIN LEVEL: Digoxin Level: <0.2 ng/mL — ABNORMAL LOW (ref 0.8–2.0)

## 2015-12-19 LAB — BASIC METABOLIC PANEL
Anion gap: 9 (ref 5–15)
BUN: 8 mg/dL (ref 6–20)
CO2: 26 mmol/L (ref 22–32)
Calcium: 8.8 mg/dL — ABNORMAL LOW (ref 8.9–10.3)
Chloride: 96 mmol/L — ABNORMAL LOW (ref 101–111)
Creatinine, Ser: 0.68 mg/dL (ref 0.44–1.00)
GFR calc Af Amer: >60 mL/min (ref >60)
GFR calc non Af Amer: >60 mL/min (ref >60)
Glucose, Bld: 102 mg/dL — ABNORMAL HIGH (ref 65–99)
Potassium: 3.9 mmol/L (ref 3.5–5.1)
Sodium: 131 mmol/L — ABNORMAL LOW (ref 135–145)

## 2015-12-19 LAB — CBC WITH DIFFERENTIAL/PLATELET
Basophils Absolute: 0 10*3/uL (ref 0.0–0.1)
Basophils Relative: 0 %
Eosinophils Absolute: 0.1 10*3/uL (ref 0.0–0.7)
Eosinophils Relative: 1 %
HCT: 37.9 % (ref 36.0–46.0)
Hemoglobin: 12.6 g/dL (ref 12.0–15.0)
Lymphocytes Relative: 26 %
Lymphs Abs: 2.2 10*3/uL (ref 0.7–4.0)
MCH: 29.6 pg (ref 26.0–34.0)
MCHC: 33.2 g/dL (ref 30.0–36.0)
MCV: 89 fL (ref 78.0–100.0)
Monocytes Absolute: 0.7 10*3/uL (ref 0.1–1.0)
Monocytes Relative: 8 %
Neutro Abs: 5.4 10*3/uL (ref 1.7–7.7)
Neutrophils Relative %: 65 %
Platelets: 291 10*3/uL (ref 150–400)
RBC: 4.26 MIL/uL (ref 3.87–5.11)
RDW: 16.3 % — ABNORMAL HIGH (ref 11.5–15.5)
WBC: 8.4 10*3/uL (ref 4.0–10.5)

## 2015-12-19 LAB — BRAIN NATRIURETIC PEPTIDE: B Natriuretic Peptide: 529 pg/mL — ABNORMAL HIGH (ref 0.0–100.0)

## 2015-12-19 LAB — URINALYSIS, ROUTINE W REFLEX MICROSCOPIC
Bilirubin Urine: NEGATIVE
Glucose, UA: NEGATIVE mg/dL
Ketones, ur: NEGATIVE mg/dL
Nitrite: NEGATIVE
Protein, ur: NEGATIVE mg/dL
Specific Gravity, Urine: <1.005 — ABNORMAL LOW (ref 1.005–1.030)
pH: 6.5 (ref 5.0–8.0)

## 2015-12-19 LAB — TROPONIN I: Troponin I: <0.03 ng/mL (ref <0.031)

## 2015-12-19 LAB — MAGNESIUM: Magnesium: 2 mg/dL (ref 1.7–2.4)

## 2015-12-19 LAB — TSH: TSH: 3.548 u[IU]/mL (ref 0.350–4.500)

## 2015-12-19 MED ORDER — SODIUM CHLORIDE 0.9 % IV SOLN
INTRAVENOUS | Status: AC
  Administered 2015-12-19: 22:00:00 via INTRAVENOUS

## 2015-12-19 MED ORDER — ENSURE ENLIVE PO LIQD
237.0000 mL | Freq: Two times a day (BID) | ORAL | Status: DC
  Administered 2015-12-20 – 2015-12-21 (×3): 237 mL via ORAL

## 2015-12-19 MED ORDER — HYDROCODONE-ACETAMINOPHEN 5-325 MG PO TABS: 1.0000 | ORAL_TABLET | ORAL | Status: DC | PRN

## 2015-12-19 MED ORDER — ACETAMINOPHEN 325 MG PO TABS: 650.0000 mg | ORAL_TABLET | Freq: Four times a day (QID) | ORAL | Status: DC | PRN

## 2015-12-19 MED ORDER — LISINOPRIL 5 MG PO TABS
5.0000 mg | ORAL_TABLET | Freq: Every day | ORAL | Status: DC
  Administered 2015-12-19 – 2015-12-21 (×3): 5 mg via ORAL
  Filled 2015-12-19 (×3): qty 1

## 2015-12-19 MED ORDER — SENNOSIDES-DOCUSATE SODIUM 8.6-50 MG PO TABS: 1.0000 | ORAL_TABLET | Freq: Every evening | ORAL | Status: DC | PRN

## 2015-12-19 MED ORDER — ENOXAPARIN SODIUM 40 MG/0.4ML ~~LOC~~ SOLN
40.0000 mg | SUBCUTANEOUS | Status: DC
  Administered 2015-12-19: 40 mg via SUBCUTANEOUS
  Filled 2015-12-19 (×2): qty 0.4

## 2015-12-19 MED ORDER — BISACODYL 5 MG PO TBEC: 5.0000 mg | DELAYED_RELEASE_TABLET | Freq: Every day | ORAL | Status: DC | PRN

## 2015-12-19 MED ORDER — ACETAMINOPHEN 650 MG RE SUPP: 650.0000 mg | Freq: Four times a day (QID) | RECTAL | Status: DC | PRN

## 2015-12-19 MED ORDER — ASPIRIN EC 81 MG PO TBEC
81.0000 mg | DELAYED_RELEASE_TABLET | Freq: Every day | ORAL | Status: DC
  Administered 2015-12-19 – 2015-12-21 (×3): 81 mg via ORAL
  Filled 2015-12-19 (×3): qty 1

## 2015-12-19 MED ORDER — NAPROXEN 250 MG PO TABS
500.0000 mg | ORAL_TABLET | Freq: Two times a day (BID) | ORAL | Status: DC
  Administered 2015-12-20 – 2015-12-21 (×3): 500 mg via ORAL
  Filled 2015-12-19 (×3): qty 2

## 2015-12-19 MED ORDER — ONDANSETRON HCL 4 MG/2ML IJ SOLN: 4.0000 mg | Freq: Four times a day (QID) | INTRAMUSCULAR | Status: DC | PRN

## 2015-12-19 MED ORDER — ONDANSETRON HCL 4 MG PO TABS
4.0000 mg | ORAL_TABLET | Freq: Four times a day (QID) | ORAL | Status: DC | PRN
  Administered 2015-12-20: 4 mg via ORAL
  Filled 2015-12-19: qty 1

## 2015-12-19 MED ORDER — METOPROLOL TARTRATE 25 MG PO TABS
25.0000 mg | ORAL_TABLET | Freq: Two times a day (BID) | ORAL | Status: DC
  Administered 2015-12-19 – 2015-12-21 (×3): 25 mg via ORAL
  Filled 2015-12-19 (×3): qty 1

## 2015-12-19 MED ORDER — SERTRALINE HCL 50 MG PO TABS
50.0000 mg | ORAL_TABLET | Freq: Every day | ORAL | Status: DC
  Administered 2015-12-19 – 2015-12-21 (×3): 50 mg via ORAL
  Filled 2015-12-19 (×3): qty 1

## 2015-12-19 NOTE — ED Provider Notes (Addendum)
CSN: [id number]     Arrival date & time 12/19/15  1700 History   First MD Initiated Contact with Patient 12/19/15 1722     Chief Complaint  Patient presents with  . Fall     (Consider location/radiation/quality/duration/timing/severity/associated sxs/prior Treatment) HPI   81 year old female presenting for repeated falls. Patient was recently admitted to [facility] last month after syncopal event. She wass found to be in new onset atrial fibrillation. She had an echocardiogram which showed an EF of 20% with severe diffuse hypokinesis. She was evaluated by cardiology. Cardiology did not recommend invasive workup. Previous cardiac catheterization in 2010 with nonobstructive CAD. Recommended medical management. She was rate controlled with metoprolol and digoxin. Felt that not good candidate for anticoagulation because of repeated falls and also hematuria during hospitalization. Hospitalization was complicated by delirium, UTI and urinary retention. She was subsequently discharged to skilled nursing facility for rehabilitation with foley in place.   From the hospital she was sent to [facility] in [place] and then back home 8 days ago. She is currently living by herself. She has continued to have several falls. She is not quite sure why. She says she will "just go down." Denies LOC. Does not think that she is necessarily tripping or losing her balance. She denies any preceding prodrome. She has struck her head more than once with these falls. Her family is concerned about her ability to adequately care for herself at home.    Past Medical History  Diagnosis Date  . Arthritis   . CAD (coronary artery disease)     Nonobstructive by cath in 2010  . New onset atrial fibrillation ([place]) 10/2015  . Nonischemic cardiomyopathy ([place])     a. EF 35-40% in 2010 b. 20-25% in 10/2015  . Cancer [facility])    Past Surgical History  Procedure Laterality Date  . Total hip arthroplasty  2011  . Abdominal  hysterectomy     Family History  Problem Relation Age of Onset  . Family history unknown: Yes   Social History  Substance Use Topics  . Smoking status: Current Every Day Smoker -- 1.00 packs/day    Types: Cigarettes  . Smokeless tobacco: Never Used  . Alcohol Use: No   OB History    No data available     Review of Systems  All systems reviewed and negative, other than as noted in HPI.   Allergies  Penicillins  Home Medications   Prior to Admission medications   Medication Sig Start Date End Date Taking? Authorizing Provider  aspirin EC 81 MG EC tablet Take 1 tablet (81 mg total) by mouth daily. 11/13/15   Kelvin Cellar, MD  digoxin 62.5 MCG TABS Take 0.0625 mg by mouth daily. 11/13/15   Kelvin Cellar, MD  lisinopril (PRINIVIL,ZESTRIL) 5 MG tablet Take 1 tablet (5 mg total) by mouth daily. 11/13/15   Kelvin Cellar, MD  metoprolol (LOPRESSOR) 50 MG tablet Take 1 tablet (50 mg total) by mouth 2 (two) times daily. 11/13/15   Kelvin Cellar, MD   BP 133/60 mmHg  Pulse 89  Temp(Src) 97.4 F (36.3 C) (Temporal)  Resp 18  Ht 5' 4.5" (1.638 m)  Wt 123 lb (55.792 kg)  BMI 20.79 kg/m2  SpO2 100% Physical Exam  Constitutional: She is oriented to person, place, and time. She appears well-developed and well-nourished. No distress.  HENT:  Head: Normocephalic and atraumatic.  Eyes: Conjunctivae are normal. Right eye exhibits no discharge. Left eye exhibits no discharge.  Neck: Neck supple.  Cardiovascular: Normal rate, regular rhythm and normal heart sounds.  Exam reveals no gallop and no friction rub.   No murmur heard. Pulmonary/Chest: Effort normal and breath sounds normal. No respiratory distress.  Abdominal: Soft. She exhibits no distension. There is no tenderness.  Genitourinary:  Foley. Pale yellow urine.   Musculoskeletal: She exhibits no edema or tenderness.  No external signs of acute trauma. No midline spinal tenderness.  Neurological: She is alert and  oriented to person, place, and time. No cranial nerve deficit. She exhibits normal muscle tone. Coordination normal.  Skin: Skin is warm and dry.  Psychiatric: She has a normal mood and affect. Her behavior is normal. Thought content normal.  Nursing note and vitals reviewed.   ED Course  Procedures (including critical care time) Labs Review Labs Reviewed  CBC WITH DIFFERENTIAL/PLATELET - Abnormal; Notable for the following:    RDW 16.3 (*)    All other components within normal limits  BASIC METABOLIC PANEL - Abnormal; Notable for the following:    Sodium 131 (*)    Chloride 96 (*)    Glucose, Bld 102 (*)    Calcium 8.8 (*)    All other components within normal limits  URINALYSIS, ROUTINE W REFLEX MICROSCOPIC (NOT AT [facility]) - Abnormal; Notable for the following:    Specific Gravity, Urine <1.005 (*)    Hgb urine dipstick MODERATE (*)    Leukocytes, UA SMALL (*)    All other components within normal limits  DIGOXIN LEVEL - Abnormal; Notable for the following:    Digoxin Level <0.2 (*)    All other components within normal limits  URINE MICROSCOPIC-ADD ON - Abnormal; Notable for the following:    Squamous Epithelial / LPF 0-5 (*)    Bacteria, UA FEW (*)    All other components within normal limits  MAGNESIUM  TROPONIN I    Imaging Review Ct Head Wo Contrast  12/19/2015  CLINICAL DATA:  Recent discharge from [facility] last Friday. Multiple falls since discharge. Patient has struck head every time but denies loss of consciousness. Dizziness upon standing and walking. Loss of appetite. Urinary retention. Nausea. EXAM: CT HEAD WITHOUT CONTRAST TECHNIQUE: Contiguous axial images were obtained from the base of the skull through the vertex without intravenous contrast. COMPARISON:  11/06/2015 FINDINGS: Mild diffuse cerebral atrophy. Mild ventricular dilatation consistent with central atrophy. Low-attenuation changes in the deep white matter consistent with small vessel ischemia. Focal  low-attenuation in the pons also likely ischemic. No change in parenchymal pattern since the previous study. No mass effect or midline shift. No abnormal extra-axial fluid collections. Gray-white matter junctions are distinct. Basal cisterns are not effaced. No evidence of acute intracranial hemorrhage. No depressed skull fractures. Opacification of several ethmoid air cells. Visualized paranasal sinuses are otherwise clear. Mastoid air cells are not opacified. Vascular calcifications. IMPRESSION: No acute intracranial abnormalities. Chronic atrophy and small vessel ischemic changes similar prior study. Electronically Signed   By: Lucienne Capers M.D.   On: 12/19/2015 19:04   I have personally reviewed and evaluated these images and lab results as part of my medical decision-making.   EKG Interpretation   Date/Time:  Saturday December 19 2015 18:29:59 EST Ventricular Rate:  86 PR Interval:    QRS Duration: 90 QT Interval:  379 QTC Calculation: 453 R Axis:   -44 Text Interpretation:  Atrial fibrillation Left axis deviation Anteroseptal  infarct, age indeterminate Confirmed by Kerrie Timm  MD, [place] ([id number]) on  12/19/2015 8:20:32  PM      MDM   Final diagnoses:  Frequent falls  Physical deconditioning  Hospital admission due to social situation    80yF with frequent falls and deconditioning. Brother and sister-in-law at bedside. They refuse to stay with patient until alternative arrangements can be made. They have legitimate concerns for her safety but I don't have medical necessity to admit to hospital. They understand that if brought in for observation that she would most likely be discharged tomorrow, have a very expensive hospital bill that insurance may not cover and may not even be placed at that point.      Virgel Manifold, MD 12/19/15 2055

## 2015-12-19 NOTE — ED Notes (Addendum)
Per family patient recently discharged from [facility] last Friday-patient has had multiple falls since. Per patient has hit head every time but denies any LOC, does report dizziness upon standing and walking. Patient has also had a loss of appetite and urinary retention. Patient does report nausea but denies vomiting. Patient has a foley in with small urine output per family.

## 2015-12-19 NOTE — H&P (Signed)
[facility] History and Physical  [name] [id number] DOB: Nov 25, 1934 DOA: 12/19/2015  Referring physician: ED physician PCP: Veronica Kilts, MD  Specialists: Dr. Louis Brady (urology), Dr. Martinique (cardiology)   Chief Complaint:  Recurrent falls   HPI: [name] is a 81 y.o. female with PMH of chronic systolic CHF, recent onset chronic atrial fibrillation, and lumbar spinal stenosis who presents to the ED after a fall at home. Patient has been living alone and suffering repeated falls. She reports striking her head, but denies loss of consciousness. She denies any other trauma or injury. She denies a loss of coordination or lightheadedness and is unable to identify a reason for the falls. She was admitted to this institution last month following a syncopal event and diagnosed with new onset atrial fibrillation. She was transferred to [facility] where she underwent further cardiac workup with admission complicated by UTI, urinary retention, and delirium. She was eventually discharged to SNF where she had been re-debilitating until returning home on 12/11/2015. Decision was made not to anticoagulate [name] during the recent hospitalization and she has had daily falls since returning home one week ago. She is brought to the ED by her brother and sister-in-law who live nearby and check in on her frequently, but are concerned with the frequent falls that she is not safe living alone. They are unwilling to stay with the patient until alternative living arrangements can be made.  In ED, patient was found to be afebrile, saturating well on room air, and with vital signs stable. EKG was obtained and demonstrates atrial fibrillation with left axis deviation but no acute ST or T-wave changes. Noncontrast head CT is negative for acute intracranial abnormality. Basic blood work including CMP, CBC, and troponin are unremarkable. Urinalysis reveals few bacteria and small leukocytes with a  low specific gravity. Patient remained stable in the emergency department and will be admitted for further evaluation and management of recurrent falls.  Where does patient live?   At home    Can patient participate in ADLs?  Yes    Review of Systems:   General:  Fatigue, loss of appetite.  No fevers, chills, sweats, weight change HEENT: no blurry vision, hearing changes or sore throat Pulm: no dyspnea, cough, or wheeze CV: no chest pain or palpitations Abd: no nausea, vomiting, abdominal pain, diarrhea, or constipation GU: no dysuria, hematuria, increased urinary frequency, or urgency  Ext: no leg edema Neuro: no focal weakness, numbness, or tingling, no vision change or hearing loss Skin: no rash, Sore on bottom  MSK: No muscle spasm, no deformity, no red, hot, or swollen joint Heme: No easy bruising or bleeding Travel history: No recent long distant travel    Allergy:  Allergies  Allergen Reactions  . Penicillins Other (See Comments)    Unknown    Past Medical History  Diagnosis Date  . Arthritis   . CAD (coronary artery disease)     Nonobstructive by cath in 2010  . New onset atrial fibrillation ([place]) 10/2015  . Nonischemic cardiomyopathy ([place])     a. EF 35-40% in 2010 b. 20-25% in 10/2015  . Cancer [facility])     Past Surgical History  Procedure Laterality Date  . Total hip arthroplasty  2011  . Abdominal hysterectomy      Social History:  reports that she has been smoking Cigarettes.  She has been smoking about 1.00 pack per day. She has never used smokeless tobacco. She reports that she does  not drink alcohol or use illicit drugs.  Family History:  Family History  Problem Relation Age of Onset  . Family history unknown: Yes     Prior to Admission medications   Medication Sig Start Date End Date Taking? Authorizing Provider  aspirin EC 81 MG EC tablet Take 1 tablet (81 mg total) by mouth daily. 11/13/15  Yes Veronica Cellar, MD  ciprofloxacin (CIPRO) 500 MG  tablet Take 500 mg by mouth 2 (two) times daily.   Yes Historical Provider, MD  lisinopril (PRINIVIL,ZESTRIL) 5 MG tablet Take 1 tablet (5 mg total) by mouth daily. 11/13/15  Yes Veronica Cellar, MD  metoprolol (LOPRESSOR) 50 MG tablet Take 1 tablet (50 mg total) by mouth 2 (two) times daily. Patient taking differently: Take 25 mg by mouth 2 (two) times daily.  11/13/15  Yes Veronica Cellar, MD  naproxen (NAPROSYN) 500 MG tablet Take 500 mg by mouth 2 (two) times daily with a meal.   Yes Historical Provider, MD  sertraline (ZOLOFT) 50 MG tablet Take 50 mg by mouth daily.   Yes Historical Provider, MD    Physical Exam: Filed Vitals:   12/19/15 1824 12/19/15 1830 12/19/15 1900 12/19/15 2000  BP:  102/54 111/77 116/63  Pulse:  85 87 77  Temp: 98.1 F (36.7 C)     TempSrc: Rectal     Resp:  17 18 21   Height:      Weight:      SpO2:  99% 99% 99%   General: Not in acute distress HEENT:       Eyes: PERRL, EOMI, no scleral icterus or conjunctival pallor.       ENT: No discharge from the ears or nose, no pharyngeal ulcers, petechiae or exudate, no tonsillar enlargement.        Neck: No JVD, no bruit, no appreciable mass Heme: No cervical adenopathy, no pallor Cardiac:  Rate ~100 and irregular, No murmurs, No gallops or rubs. Pulm: Good air movement bilaterally. No rales, wheezing, rhonchi or rubs. Abd: Soft, nondistended, nontender, no rebound pain or gaurding, no mass or organomegaly, BS present. Ext: No LE edema bilaterally. 2+DP/PT pulse bilaterally. Musculoskeletal: No gross deformity, no red, hot, swollen joints, no limitation in ROM  Skin: No rashes or wounds on exposed surfaces  Neuro: Alert, oriented X3, cranial nerves II-XII grossly intact. No focal findings Psych: Patient is not overtly psychotic, appropriate mood and affect.  Labs on Admission:  Basic Metabolic Panel:  Recent Labs Lab 12/19/15 1755  NA 131*  K 3.9  CL 96*  CO2 26  GLUCOSE 102*  BUN 8  CREATININE 0.68   CALCIUM 8.8*  MG 2.0   Liver Function Tests: No results for input(s): AST, ALT, ALKPHOS, BILITOT, PROT, ALBUMIN in the last 168 hours. No results for input(s): LIPASE, AMYLASE in the last 168 hours. No results for input(s): AMMONIA in the last 168 hours. CBC:  Recent Labs Lab 12/19/15 1755  WBC 8.4  NEUTROABS 5.4  HGB 12.6  HCT 37.9  MCV 89.0  PLT 291   Cardiac Enzymes:  Recent Labs Lab 12/19/15 1755  TROPONINI <0.03    BNP (last 3 results)  Recent Labs  11/09/15 0014  BNP 1757.7*    ProBNP (last 3 results) No results for input(s): PROBNP in the last 8760 hours.  CBG: No results for input(s): GLUCAP in the last 168 hours.  Radiological Exams on Admission: Ct Head Wo Contrast  12/19/2015  CLINICAL DATA:  Recent discharge from [place] center  last Friday. Multiple falls since discharge. Patient has struck head every time but denies loss of consciousness. Dizziness upon standing and walking. Loss of appetite. Urinary retention. Nausea. EXAM: CT HEAD WITHOUT CONTRAST TECHNIQUE: Contiguous axial images were obtained from the base of the skull through the vertex without intravenous contrast. COMPARISON:  11/06/2015 FINDINGS: Mild diffuse cerebral atrophy. Mild ventricular dilatation consistent with central atrophy. Low-attenuation changes in the deep white matter consistent with small vessel ischemia. Focal low-attenuation in the pons also likely ischemic. No change in parenchymal pattern since the previous study. No mass effect or midline shift. No abnormal extra-axial fluid collections. Gray-white matter junctions are distinct. Basal cisterns are not effaced. No evidence of acute intracranial hemorrhage. No depressed skull fractures. Opacification of several ethmoid air cells. Visualized paranasal sinuses are otherwise clear. Mastoid air cells are not opacified. Vascular calcifications. IMPRESSION: No acute intracranial abnormalities. Chronic atrophy and small vessel ischemic  changes similar prior study. Electronically Signed   By: Lucienne Capers M.D.   On: 12/19/2015 19:04    EKG: Independently reviewed.  Abnormal findings:   Atrial fibrillation, LAD   Assessment/Plan  1. Recurrent falls  - Uncertain etiology, suspect physical deconditioning and orthostasis  - CT head normal, chemistries unremarkable  - Check orthostatics  - PT eval  - CM consultation requested for anticipated long-term placement needs  - Gentle IV hydration  - Monitor with fall precautions    2. Atrial fibrillation  - Rate is well-controlled on metoprolol  - Continue Lopressor 25 mg BID  - Continue ASA 81 qD   - Not anticoagulated d/t falls  - Monitor rate, intervene prn    3. Chronic systolic CHF  - EF 0000000 in December '16  - Non-obstructive angiography in 2010  - Continue lisinopril, metoprolol, ASA 81  - Follow I/Os  - Daily wts, fluid-restrict  - Caution with IVF   4. Urinary retention - Developed during hospitalization last month  - Recently treated for UTI, urine culture ordered  - Was refered to see Dr. Louis Brady of urology outpt, but appt not yet arranged  - Routine foley care      DVT ppx:   SQ Lovenox     Code Status: Full code Family Communication:   Yes, patient's brother and sister-in-law at bed side Disposition Plan: Admit to inpatient   Date of Service 12/19/2015    Vianne Bulls, MD [facility] Pager [phone]  If 7PM-7AM, please contact night-coverage [email or web address] Password [facility] 12/19/2015, 9:26 PM

## 2015-12-20 DIAGNOSIS — I5022 Chronic systolic (congestive) heart failure: Secondary | ICD-10-CM | POA: Diagnosis not present

## 2015-12-20 DIAGNOSIS — I4891 Unspecified atrial fibrillation: Secondary | ICD-10-CM | POA: Diagnosis not present

## 2015-12-20 DIAGNOSIS — R296 Repeated falls: Secondary | ICD-10-CM

## 2015-12-20 DIAGNOSIS — R339 Retention of urine, unspecified: Secondary | ICD-10-CM

## 2015-12-20 DIAGNOSIS — R5381 Other malaise: Secondary | ICD-10-CM | POA: Diagnosis not present

## 2015-12-20 LAB — GLUCOSE, CAPILLARY: GLUCOSE-CAPILLARY: 94 mg/dL (ref 65–99)

## 2015-12-20 LAB — T4, FREE: Free T4: 1.14 ng/dL — ABNORMAL HIGH (ref 0.61–1.12)

## 2015-12-20 MED ORDER — SODIUM CHLORIDE 0.9 % IV SOLN
INTRAVENOUS | Status: DC
  Administered 2015-12-20 – 2015-12-21 (×2): via INTRAVENOUS

## 2015-12-20 MED ORDER — HALOPERIDOL LACTATE 5 MG/ML IJ SOLN
2.0000 mg | Freq: Four times a day (QID) | INTRAMUSCULAR | Status: DC | PRN
  Administered 2015-12-20: 2 mg via INTRAVENOUS
  Filled 2015-12-20: qty 1

## 2015-12-20 NOTE — Consult Note (Signed)
WOC consulted for sacral ulcer, however I have discussed with the bedside nurse and while the patient does have some redness over the sacrum but it does blanch.  Sacral foam in place for prophylaxis.   Discussed POC with bedside nurse.  Re consult if needed, will not follow at this time. Thanks  Shikha Bibb [place], [place] [phone])

## 2015-12-20 NOTE — Progress Notes (Signed)
[facility] PROGRESS NOTE  ALAJA Brady [id number] DOB: [birth year] DOA: 12/19/2015 PCP: Purvis Kilts, MD  Assessment/Plan: 1. Recurrent falls, etiology unclear. Suspected physical deconditioning and orthostasis. Placed on fall precautions and PT evaluation requested. CT head unremarkable. May possibly need long-term placement. Started on gentle hydration.  2. Atrial fibrillation with RVR, rate controled on metoprolol. Continue Lopressor and ASA. Not on chronic anticoagulations due to falls. Continue to monitor.  3. Chronic systolic CHF. Last ECHO 12/16 EF 20-25%. Continue to monitor weights, fluid restrictions. Non-obstructive angiography in 2010. 4. Urinary retention, routine foley care. Recently treated for UTI, UC in process. Was referred to Dr. Louis Meckel, urology, but appointment has not been arranged.  5. Physical deconditioning, PT has been consulted.  6. Sacral ulcer, noted on admission. Appreciate WOC recommendations.  7. Agitation, patient had similar change of mental status during g last admission. Suspect she may have some aspect of underlying dementia that effects her hospital stay. UA show no signs of infection. There are no signs of pneumonia and she is afebrile. She is not on any sedative medications. Will use Haldol PRN .   Code Status: Full DVT prophylaxis: Lovenox Family Communication: Discussed with patient who understands and has no concerns at this time. No family bedside.  Disposition Plan: Anticipate discharge within 1-2 days.    Consultants:  WOC   Procedures:  None  Antibiotics:  None  HPI/Subjective: Is aware of recurrent falls. States sometimes she feels as though her feet give out and other times she blacks out. She typically uses an came and walker when ambulating. She lives alone.   Nurse reports increased confusion and agitation overnight and this morning.  Objective: Filed Vitals:   12/19/15 2200 12/19/15 2250  BP: 138/81 122/64   Pulse: 84 85  Temp:  98.6 F (37 C)  Resp: 17 18    Intake/Output Summary (Last 24 hours) at 12/20/15 0654 Last data filed at 12/20/15 0600  Gross per 24 hour  Intake      0 ml  Output    551 ml  Net   -551 ml   Filed Weights   12/19/15 1717 12/19/15 2250 12/20/15 0500  Weight: 55.792 kg (123 lb) 52.164 kg (115 lb) 52.164 kg (115 lb)    Exam: General: NAD.  Cardiovascular: RRR, S1, S2  Respiratory: clear bilaterally, No wheezing, rales or rhonchi Abdomen: soft, non tender, no distention , bowel sounds normal Musculoskeletal: No edema b/l Psychiatric" Alert and oriented to self, place, reason for being in the hospital. Confused of month and year   Data Reviewed: Basic Metabolic Panel:  Recent Labs Lab 12/19/15 1755  NA 131*  K 3.9  CL 96*  CO2 26  GLUCOSE 102*  BUN 8  CREATININE 0.68  CALCIUM 8.8*  MG 2.0   CBC:  Recent Labs Lab 12/19/15 1755  WBC 8.4  NEUTROABS 5.4  HGB 12.6  HCT 37.9  MCV 89.0  PLT 291   Cardiac Enzymes:  Recent Labs Lab 12/19/15 1755  TROPONINI <0.03   BNP (last 3 results)  Recent Labs  11/09/15 0014 12/19/15 1755  BNP 1757.7* 529.0*     Studies: Ct Head Wo Contrast  12/19/2015  CLINICAL DATA:  Recent discharge from [place] center last Friday. Multiple falls since discharge. Patient has struck head every time but denies loss of consciousness. Dizziness upon standing and walking. Loss of appetite. Urinary retention. Nausea. EXAM: CT HEAD WITHOUT CONTRAST TECHNIQUE: Contiguous axial images were obtained from the base  of the skull through the vertex without intravenous contrast. COMPARISON:  11/06/2015 FINDINGS: Mild diffuse cerebral atrophy. Mild ventricular dilatation consistent with central atrophy. Low-attenuation changes in the deep white matter consistent with small vessel ischemia. Focal low-attenuation in the pons also likely ischemic. No change in parenchymal pattern since the previous study. No mass effect or midline  shift. No abnormal extra-axial fluid collections. Gray-white matter junctions are distinct. Basal cisterns are not effaced. No evidence of acute intracranial hemorrhage. No depressed skull fractures. Opacification of several ethmoid air cells. Visualized paranasal sinuses are otherwise clear. Mastoid air cells are not opacified. Vascular calcifications. IMPRESSION: No acute intracranial abnormalities. Chronic atrophy and small vessel ischemic changes similar prior study. Electronically Signed   By: Lucienne Capers M.D.   On: 12/19/2015 19:04    Scheduled Meds: . aspirin EC  81 mg Oral Daily  . enoxaparin (LOVENOX) injection  40 mg Subcutaneous Q24H  . feeding supplement (ENSURE ENLIVE)  237 mL Oral BID BM  . lisinopril  5 mg Oral Daily  . metoprolol  25 mg Oral BID  . naproxen  500 mg Oral BID WC  . sertraline  50 mg Oral Daily   Continuous Infusions:   Principal Problem:   Recurrent falls Active Problems:   Physical deconditioning   Atrial fibrillation with rapid ventricular response (HCC)   Chronic systolic CHF (congestive heart failure) ([place])   Urinary retention    Time spent: 25 minutes     Kathie Dike, MD  [facility] Pager [phone]. If 7PM-7AM, please contact night-coverage at [email or web address], password [facility] 12/20/2015, 6:54 AM       By signing my name below, I, Rennis Harding, attest that this documentation has been prepared under the direction and in the presence of Kathie Dike, MD. Electronically signed: Rennis Harding, Scribe. 12/20/2015 1:04PM   I, Dr. Kathie Dike, personally performed the services described in this documentaiton. All medical record entries made by the scribe were at my direction and in my presence. I have reviewed the chart and agree that the record reflects my personal performance and is accurate and complete  Kathie Dike, MD, 12/20/2015 1:18 PM

## 2015-12-21 DIAGNOSIS — R296 Repeated falls: Secondary | ICD-10-CM | POA: Diagnosis not present

## 2015-12-21 DIAGNOSIS — R5381 Other malaise: Secondary | ICD-10-CM | POA: Diagnosis not present

## 2015-12-21 DIAGNOSIS — I5022 Chronic systolic (congestive) heart failure: Secondary | ICD-10-CM | POA: Diagnosis not present

## 2015-12-21 DIAGNOSIS — I4891 Unspecified atrial fibrillation: Secondary | ICD-10-CM | POA: Diagnosis not present

## 2015-12-21 LAB — GLUCOSE, CAPILLARY: Glucose-Capillary: 94 mg/dL (ref 65–99)

## 2015-12-21 NOTE — Progress Notes (Signed)
Initial Nutrition Assessment  DOCUMENTATION CODES:   Severe malnutrition in context of chronic illness  INTERVENTION:  Ensure Enlive po BID, each supplement provides 350 kcal and 20 grams of protein. Pt prefers chocolate.  Provided Heart Failure education handouts and briefly discussed guidelines. Will follow up with pt when she is feeling better to address additional questions.  Recommend consider trial of appetite stimulant.  NUTRITION DIAGNOSIS:   Malnutrition related to chronic illness, poor appetite as evidenced by percent weight loss.   GOAL:   Patient will meet greater than or equal to 90% of their needs, Weight gain   MONITOR:   PO intake, Supplement acceptance, Labs, Weight trends, I & O's  REASON FOR ASSESSMENT:   Malnutrition Screening Tool    ASSESSMENT:  Pt has hx of chronic CHF, new atrial fib (dx in December) and has been having recurrent falls. Since November she has severe unplanned weight loss (13%). Pt says she has not been hungry. Her family also affirms she has not been eating well and they voiced  Concern about her being at home alone and has no one to help her. They are hoping to have her placed in a SNF.   Diet hx: have been bringing her foods such as biscuit for breakfast and hot plate meal in the evening (chicken, green beans and mashed potatoes). She drinks tea water and Ensures (2-3) daily.   Pt says she was taking something for her appetite when at [facility] that called "sherry" question of maybe was taking Eldertonic?? It's been 5-6 weeks at least since the decline in her appetite. A trial of appetite stimulant may be beneficial?  NFPE conducted findings are fat depletion, muscle depletion and no edema.  Labs: Sodium 131, BNP-529      Diet Order:  Diet Heart Room service appropriate?: Yes; Fluid consistency:: Thin; Fluid restriction:: 1500 mL Fluid  Skin:   redness to sacral area but no breakdown  Last BM:   1/29 soft brown Height:    Ht Readings from Last 1 Encounters:  12/19/15 5\' 4"  (1.626 m)    Weight:   Wt Readings from Last 1 Encounters:  12/21/15 117 lb 14.4 oz (53.479 kg)    Ideal Body Weight:  54.5 kg  BMI:  Body mass index is 20.23 kg/(m^2).  Estimated Nutritional Needs:   Kcal:  1300-1500   Protein:  65-70 gr  Fluid:  1.5 liters   EDUCATION NEEDS:   Education needs addressed  Colman Cater MS,RD,CSG,LDN Office: [phone] Pager: [phone]

## 2015-12-21 NOTE — Care Management Obs Status (Signed)
[place] NOTIFICATION   Patient Details  Name: [name] MRN: [id number] Date of Birth: 06-May-1935   Medicare Observation Status Notification Given:  Yes    Sherald Barge, RN 12/21/2015, 4:22 PM

## 2015-12-21 NOTE — NC FL2 (Signed)
[place] MEDICAID FL2 LEVEL OF CARE SCREENING TOOL     IDENTIFICATION  Patient Name: [name] Birthdate: Mar 04, 1935 Sex: female Admission Date (Current Location): 12/19/2015  [facility] and [place] Number:  [employer] and Address:  [address], [place]      Provider Number: [id number]  Attending Physician Name and Address:  Kathie Dike, MD  Relative Name and Phone Number:       Current Level of Care: Hospital Recommended Level of Care: [place] Prior Approval Number:    Date Approved/Denied:   PASRR Number: [id number]  Discharge Plan: SNF    Current Diagnoses: Patient Active Problem List   Diagnosis Date Noted  . Recurrent falls 12/19/2015  . Frequent falls   . Urinary retention   . Pressure ulcer 11/10/2015  . Chronic systolic CHF (congestive heart failure) ([place])   . Acute encephalopathy 11/09/2015  . Physical deconditioning 11/09/2015  . Abdominal pain   . Atrial fibrillation with rapid ventricular response ([place])   . VT (ventricular tachycardia) ([place]) 11/07/2015  . Dehydration 11/06/2015  . Syncope 11/06/2015  . Atrial fibrillation with RVR ([place]) 11/06/2015  . UTI (lower urinary tract infection) 11/06/2015  . ARF (acute renal failure) ([place]) 11/06/2015  . Leukocytosis 11/06/2015    Orientation RESPIRATION BLADDER Height & Weight     Self, Time, Situation, Place  O2 Continent Weight: 117 lb 14.4 oz (53.479 kg) Height:  5\' 4"  (162.6 cm)  BEHAVIORAL SYMPTOMS/MOOD NEUROLOGICAL BOWEL NUTRITION STATUS      Continent Diet  AMBULATORY STATUS COMMUNICATION OF NEEDS Skin   Limited Assist Verbally Other (Comment)                       Personal Care Assistance Level of Assistance  Bathing, Dressing           Functional Limitations Info             SPECIAL CARE FACTORS FREQUENCY  PT (By licensed PT), OT (By licensed OT)                    Contractures       Additional Factors Info                  Current Medications (12/21/2015):  This is the current hospital active medication list Current Facility-Administered Medications  Medication Dose Route Frequency Provider Last Rate Last Dose  . acetaminophen (TYLENOL) tablet 650 mg  650 mg Oral Q6H PRN Vianne Bulls, MD       Or  . acetaminophen (TYLENOL) suppository 650 mg  650 mg Rectal Q6H PRN Vianne Bulls, MD      . aspirin EC tablet 81 mg  81 mg Oral Daily Vianne Bulls, MD   81 mg at 12/21/15 0907  . bisacodyl (DULCOLAX) EC tablet 5 mg  5 mg Oral Daily PRN Ilene Qua Opyd, MD      . enoxaparin (LOVENOX) injection 40 mg  40 mg Subcutaneous Q24H Vianne Bulls, MD   40 mg at 12/19/15 2137  . feeding supplement (ENSURE ENLIVE) (ENSURE ENLIVE) liquid 237 mL  237 mL Oral BID BM Phillips Grout, MD   237 mL at 12/21/15 0908  . haloperidol lactate (HALDOL) injection 2 mg  2 mg Intravenous Q6H PRN Kathie Dike, MD   2 mg at 12/20/15 2125  . HYDROcodone-acetaminophen (NORCO/VICODIN) 5-325 MG per tablet 1-2 tablet  1-2 tablet Oral Q4H PRN Vianne Bulls, MD      . lisinopril (PRINIVIL,ZESTRIL) tablet 5 mg  5 mg Oral Daily Vianne Bulls, MD   5 mg at 12/21/15 0907  . metoprolol tartrate (LOPRESSOR) tablet 25 mg  25 mg Oral BID Vianne Bulls, MD   25 mg at 12/21/15 0908  . naproxen (NAPROSYN) tablet 500 mg  500 mg Oral BID WC Vianne Bulls, MD   500 mg at 12/21/15 [id number]  . ondansetron (ZOFRAN) tablet 4 mg  4 mg Oral Q6H PRN Vianne Bulls, MD   4 mg at 12/20/15 0900   Or  . ondansetron (ZOFRAN) injection 4 mg  4 mg Intravenous Q6H PRN Vianne Bulls, MD      . senna-docusate (Senokot-S) tablet 1 tablet  1 tablet Oral QHS PRN Vianne Bulls, MD      . sertraline (ZOLOFT) tablet 50 mg  50 mg Oral Daily Vianne Bulls, MD   50 mg at 12/21/15 0907     Discharge Medications: Please see discharge summary for a list of discharge medications.  Relevant Imaging Results:  Relevant Lab  Results:   Additional Information SSN: [id number]  Ludwig Clarks, LCSW

## 2015-12-21 NOTE — Evaluation (Signed)
Physical Therapy Evaluation Patient Details Name: [name] MRN: [id number] DOB: 08-Jul-1935 Today's Date: 12/21/2015   History of Present Illness  81yo white female recently DC from SNF ~10da, who sustained a fall at home, hitting head. Pt was recently admittied to [facility] for fall at home found unresponsive. Pt here under observation for recurrent falls.   Clinical Impression  Pt received asleep in bed upon entry, but is awakened easily, however remains drowsy and irritable during the first half, displeased with line of questioning to gather subjective history. Pt reports she falls frequently at home, but cannot estimate frequency. She reports she cannot predict when it will happen, her legs simply give out. She denies dizziness, loss of vision, or tripping over obstacles associated with these falls, and although she reports good compliance with RW in house, she also reports never having been inside of her RW during a fall. The patient requires minA for all bed mobility, transfers, and minGuard for ambulation, a big decline from her baseline wherein she was fully indep in ADL, IADL, groceries, cooking, driving, and community distance AMB c RW. During gait trial, pt demonstrates a steady decline in functional strength, most noticeable with starting out mild foot drop and bent knee on R side, gradually evolving into dragging of the R foot during swing phase of gait. Pt demonstrates poor safety awareness, showing frequent difficulty navigating obstacles in the hallway, her RW wheels getting stuck behind other objects multiple times. Pt is at high falls risk base off of history of frequent falls at home and gait speed less than 0.67m/ which is inappropriate for community dwellers. Pt will benefit from skilled PT intervention to restore to PLOF to improve independence in IADL and to reduce falls in the home.     Follow Up Recommendations SNF    Equipment Recommendations  None recommended by PT     Recommendations for Other Services       Precautions / Restrictions Precautions Precautions: None Precaution Comments: history of recurrent falls Restrictions Weight Bearing Restrictions: No      Mobility  Bed Mobility Overal bed mobility: Modified Independent Bed Mobility: Sidelying to Sit   Sidelying to sit: Modified independent (Device/Increase time)       General bed mobility comments: Pt is upset that PT will not provide any assistance.   Transfers Overall transfer level: Needs assistance Equipment used: Rolling walker (2 wheeled) Transfers: Sit to/from Stand Sit to Stand: Min assist;From elevated surface         General transfer comment: moderate weakness in BLE; unsafe use of RW for trasnfer.   Ambulation/Gait   Ambulation Distance (Feet): 65 Feet (Patient walks 35feet, reports her legs are giving out, stands to rest and then returns to room after some recovery. ) Assistive device: Rolling walker (2 wheeled)   Gait velocity: 0.21m/s. at risk for falls in home.  Gait velocity interpretation: <1.8 ft/sec, indicative of risk for recurrent falls General Gait Details: stepthrough gait bilat with decreased step length bilat, absent heel strike on R side, with progressively worsening foot clearance/hip flexion with prolonged activity.   Stairs            Wheelchair Mobility    Modified Rankin (Stroke Patients Only)       Balance Overall balance assessment: No apparent balance deficits (not formally assessed)  Pertinent Vitals/Pain Pain Assessment:  (reports mild chronic back pain from prolonged bedrest. )    Home Living Family/patient expects to be discharged to:: Private residence Living Arrangements: Alone   Type of Home: House Home Access: Stairs to enter Entrance Stairs-Rails: None Entrance Stairs-Number of Steps: 2 at back Home Layout: One level        Prior Function Level of  Independence: Independent with assistive device(s)         Comments: usually ambulated with a walker in and outside of home, but reports that she was not near RW when she sustained any of her previous falls.      Hand Dominance   Dominant Hand: Right    Extremity/Trunk Assessment   Upper Extremity Assessment: Generalized weakness;Overall [facility] for tasks assessed           Lower Extremity Assessment: Generalized weakness;Overall WFL for tasks assessed         Communication   Communication: No difficulties  Cognition Arousal/Alertness: Lethargic Behavior During Therapy: Agitated (Pt displeased with questioning, asking to PT stop asking questions multiple times within session. ) Overall Cognitive Status: No family/caregiver present to determine baseline cognitive functioning                      General Comments      Exercises        Assessment/Plan    PT Assessment Patient needs continued PT services  PT Diagnosis Difficulty walking;Abnormality of gait;Generalized weakness   PT Problem List Decreased strength;Decreased range of motion;Decreased activity tolerance;Decreased balance;Decreased safety awareness  PT Treatment Interventions Gait training;Functional mobility training;Therapeutic activities;Therapeutic exercise;Stair training;Balance training;DME instruction   PT Goals (Current goals can be found in the Care Plan section) Acute Rehab PT Goals Patient Stated Goal: Pt want sto return home, but has no long-term plan for reducing falls in home when asked.  PT Goal Formulation: With patient Time For Goal Achievement: 01/04/16 Potential to Achieve Goals: Good    Frequency Min 3X/week   Barriers to discharge Decreased caregiver support;Inaccessible home environment      Co-evaluation               End of Session Equipment Utilized During Treatment: Gait belt Activity Tolerance: Patient limited by fatigue;Patient tolerated treatment  well;Treatment limited secondary to agitation Patient left: in chair;with nursing/sitter in room;with call bell/phone within reach Nurse Communication: Other (comment)    Functional Limitation: Mobility: Walking and moving around Mobility: Walking and Moving Around Current Status [id number]): At least 40 percent but less than 60 percent impaired, limited or restricted Mobility: Walking and Moving Around Goal Status [phone]): At least 40 percent but less than 60 percent impaired, limited or restricted    Time: 1205-1226 PT Time Calculation (min) (ACUTE ONLY): 21 min   Charges:   PT Evaluation $PT Eval Moderate Complexity: 1 Procedure     PT G Codes:   PT G-Codes **NOT FOR INPATIENT CLASS** Functional Limitation: Mobility: Walking and moving around Mobility: Walking and Moving Around Current Status [id number]): At least 40 percent but less than 60 percent impaired, limited or restricted Mobility: Walking and Moving Around Goal Status [phone]): At least 40 percent but less than 60 percent impaired, limited or restricted   1:04 PM, 12/21/2015 Etta Grandchild, PT, DPT PRN Physical Therapist at [place] License # [id number] (wireless)  [phone] (mobile)

## 2015-12-21 NOTE — Care Management Note (Signed)
Case Management Note  Patient Details  Name: [name] MRN: [id number] Date of Birth: 24-Jan-1935  Subjective/Objective:                  Pt from home, lives alone and was ind with ADL's but fell frequently. Pt recently DC'd from SNF. Pt's family unable to care for her and has requested SNF. PT eval agree's with SNF,   Action/Plan: CSW is aware and has arranged for placement. No CM needs.   Expected Discharge Date:    12/21/2015              Expected Discharge Plan:  Skilled Nursing Facility  In-House Referral:  Clinical Social Work  Discharge planning Services  CM Consult  Post Acute Care Choice:  NA Choice offered to:  NA  DME Arranged:    DME Agency:     HH Arranged:    [place] Agency:     Status of Service:  Completed, signed off  Medicare Important Message Given:    Date Medicare IM Given:    Medicare IM give by:    Date Additional Medicare IM Given:    Additional Medicare Important Message give by:     If discussed at [place] of Stay Meetings, dates discussed:    Additional Comments:  Sherald Barge, RN 12/21/2015, 4:20 PM

## 2015-12-21 NOTE — Progress Notes (Signed)
[facility] has accepted patient- awaiting SNF auth from [facility] [employer].  Eduard Clos, MSW, [place]

## 2015-12-21 NOTE — Progress Notes (Signed)
Report called to [facility] All questions answered. Pt discharged via EMS to [place]

## 2015-12-21 NOTE — Clinical Social Work Placement (Signed)
   CLINICAL SOCIAL WORK PLACEMENT  NOTE  Date:  12/21/2015  Patient Details  Name: SOLENE VASILIOU MRN: [id number] Date of Birth: July 12, 1935  Clinical Social Work is seeking post-discharge placement for this patient at the [place] level of care (*CSW will initial, date and re-position this form in  chart as items are completed):  Yes   Patient/family provided with [place] Work Department's list of facilities offering this level of care within the geographic area requested by the patient (or if unable, by the patient's family).  Yes   Patient/family informed of their freedom to choose among providers that offer the needed level of care, that participate in Medicare, Medicaid or managed care program needed by the patient, have an available bed and are willing to accept the patient.  Yes   Patient/family informed of [facility]'s ownership interest in [facility] and [facility], as well as of the fact that they are under no obligation to receive care at these facilities.  PASRR submitted to EDS on       PASRR number received on       Existing PASRR number confirmed on 12/21/15     FL2 transmitted to all facilities in geographic area requested by pt/family on 12/21/15     FL2 transmitted to all facilities within larger geographic area on       Patient informed that his/her managed care company has contracts with or will negotiate with certain facilities, including the following:        Yes   Patient/family informed of bed offers received.  Patient chooses bed at       Physician recommends and patient chooses bed at      Patient to be transferred to   on  .  Patient to be transferred to facility by       Patient family notified on   of transfer.  Name of family member notified:        PHYSICIAN Please sign FL2     Additional Comment:    _______________________________________________ Ludwig Clarks, LCSW 12/21/2015,  11:08 AM

## 2015-12-21 NOTE — Discharge Summary (Signed)
Physician Discharge Summary  [name] [id number] DOB: 1935-11-09 DOA: 12/19/2015  PCP: Purvis Kilts, MD  Admit date: 12/19/2015 Discharge date: 12/21/2015  Time spent: 35 minutes  Recommendations for Outpatient Follow-up:  1. Follow up with PCP within 1-2 weeks.  2. Follow up wit urology as outpatient, for urinary retention.  3. Discharge to SNF   Discharge Diagnoses:  Principal Problem:   Recurrent falls Active Problems:   Physical deconditioning   Atrial fibrillation with rapid ventricular response (HCC)   Chronic systolic CHF (congestive heart failure) ([place])   Urinary retention   Discharge Condition: Improved   Diet recommendation: Heart healthy   Filed Weights   12/19/15 2250 12/20/15 0500 12/21/15 0559  Weight: 52.164 kg (115 lb) 52.164 kg (115 lb) 53.479 kg (117 lb 14.4 oz)    History of present illness:  36 yow with PMH of chronic systolic CHF, and afib presented with complaints of weakness after fall.  While in the ED she remained stable. She was referred for admission to history of falls for further evaluation.   Hospital Course:  Patient was admitted for recurrent falls, for which is etiology unclear. Suspected physical deconditioning and orthostasis. Placed on fall precautions. CT head and labs were unremarkable. Started on gentle hydration with improvement. PT has evaluated and recommended SNF. 1. Atrial fibrillation with RVR, rate controled on metoprolol. Continue Lopressor and ASA. Not on chronic anticoagulations due to falls.  2. Chronic systolic CHF. Last ECHO 12/16 EF 20-25%. Continue to monitor weights, fluid restrictions. Non-obstructive angiography in 2010. 3. Urinary retention, routine foley care. Follow up with urology as outpatient 4. Physical deconditioning, PT has been consulted.  5. Sacral ulcer, noted on admission. Appreciate WOC recommendations.  6. Agitation, patient had similar change of mental status during last admission.  Suspect she may have some aspect of underlying dementia that worsens during her hospital stay. UA show no signs of infection. There are no signs of pneumonia and she is afebrile. She is not on any sedative medications. Haldol used PRN. At this time she is calm and co operative, she knows where she is.  Procedures:  None  Consultations:  PT-SNF  Discharge Exam: Filed Vitals:   12/20/15 2045 12/21/15 0559  BP: 146/82 119/92  Pulse: 109 60  Temp: 98.7 F (37.1 C) 98.9 F (37.2 C)  Resp: 18 16   General: NAD, looks comfortable Cardiovascular: RRR, S1, S2  Respiratory: clear bilaterally, No wheezing, rales or rhonchi Abdomen: soft, non tender, no distention , bowel sounds normal Musculoskeletal: No edema b/l  Discharge Instructions   Discharge Instructions    Diet - low sodium heart healthy    Complete by:  As directed      Increase activity slowly    Complete by:  As directed           Current Discharge Medication List    CONTINUE these medications which have NOT CHANGED   Details  aspirin EC 81 MG EC tablet Take 1 tablet (81 mg total) by mouth daily. Qty: 30 tablet, Refills: 1    lisinopril (PRINIVIL,ZESTRIL) 5 MG tablet Take 1 tablet (5 mg total) by mouth daily. Qty: 30 tablet, Refills: 1    metoprolol (LOPRESSOR) 50 MG tablet Take 1 tablet (50 mg total) by mouth 2 (two) times daily. Qty: 30 tablet, Refills: 1`    naproxen (NAPROSYN) 500 MG tablet Take 500 mg by mouth 2 (two) times daily with a meal.    sertraline (ZOLOFT) 50 MG  tablet Take 50 mg by mouth daily.      STOP taking these medications     ciprofloxacin (CIPRO) 500 MG tablet        Allergies  Allergen Reactions  . Penicillins Other (See Comments)    Unknown   Follow-up Information    Follow up with [facility].   Contact information:   [place] [phone]       The results of significant diagnostics from this  hospitalization (including imaging, microbiology, ancillary and laboratory) are listed below for reference.    Significant Diagnostic Studies: Ct Head Wo Contrast  12/19/2015  CLINICAL DATA:  Recent discharge from [place] [facility] last Friday. Multiple falls since discharge. Patient has struck head every time but denies loss of consciousness. Dizziness upon standing and walking. Loss of appetite. Urinary retention. Nausea. EXAM: CT HEAD WITHOUT CONTRAST TECHNIQUE: Contiguous axial images were obtained from the base of the skull through the vertex without intravenous contrast. COMPARISON:  11/06/2015 FINDINGS: Mild diffuse cerebral atrophy. Mild ventricular dilatation consistent with central atrophy. Low-attenuation changes in the deep white matter consistent with small vessel ischemia. Focal low-attenuation in the pons also likely ischemic. No change in parenchymal pattern since the previous study. No mass effect or midline shift. No abnormal extra-axial fluid collections. Gray-white matter junctions are distinct. Basal cisterns are not effaced. No evidence of acute intracranial hemorrhage. No depressed skull fractures. Opacification of several ethmoid air cells. Visualized paranasal sinuses are otherwise clear. Mastoid air cells are not opacified. Vascular calcifications. IMPRESSION: No acute intracranial abnormalities. Chronic atrophy and small vessel ischemic changes similar prior study. Electronically Signed   By: Lucienne Capers M.D.   On: 12/19/2015 19:04    Microbiology: Recent Results (from the past 240 hour(s))  Urine culture     Status: None (Preliminary result)   Collection Time: 12/19/15  7:04 PM  Result Value Ref Range Status   Specimen Description URINE, CATHETERIZED  Final   Special Requests Normal  Final   Culture   Final    NO GROWTH < 24 HOURS Performed at [facility]    Report Status PENDING  Incomplete     Labs: Basic Metabolic Panel:  Recent Labs Lab 12/19/15 1755   NA 131*  K 3.9  CL 96*  CO2 26  GLUCOSE 102*  BUN 8  CREATININE 0.68  CALCIUM 8.8*  MG 2.0    Recent Labs Lab 12/19/15 1755  WBC 8.4  NEUTROABS 5.4  HGB 12.6  HCT 37.9  MCV 89.0  PLT 291   Cardiac Enzymes:  Recent Labs Lab 12/19/15 1755  TROPONINI <0.03   BNP: BNP (last 3 results)  Recent Labs  11/09/15 0014 12/19/15 1755  BNP 1757.7* 529.0*   CBG:  Recent Labs Lab 12/20/15 0848 12/21/15 0748  GLUCAP 94 94       Signed:  Kathie Dike, MD    [facility] 12/21/2015, 2:26 PM   By signing my name below, I, Rennis Harding, attest that this documentation has been prepared under the direction and in the presence of Kathie Dike, MD. Electronically signed: Rennis Harding, Scribe. 12/21/2015 10:59AM   I, Dr. Kathie Dike, personally performed the services described in this documentaiton. All medical record entries made by the scribe were at my direction and in my presence. I have reviewed the chart and agree that the record reflects my personal performance and is accurate and complete  Kathie Dike, MD, 12/21/2015 2:26  PM

## 2015-12-21 NOTE — Plan of Care (Signed)
Problem: Acute Rehab PT Goals(only PT should resolve) Goal: Pt Will Ambulate Pt will ambulate with RW at Supervision using a step-through pattern and equal step length for distances greater than 355ft to demonstrate the ability to perform safe household distance ambulation at discharge.    Goal: Pt Will Go Up/Down Stairs Pt will ascend/descend 5 stairs with LRAD and 1 HR at Supervision to demonstrate safe entry/exit of home.

## 2015-12-22 LAB — URINE CULTURE

## 2016-01-20 DEATH — deceased

## 2016-08-01 IMAGING — CT CT ABD-PEL WO/W CM
3 of 13 series · 11 of 46 positions shown, 17 images · IV contrast (Omni 300)
Comparison: None.

CLINICAL DATA: Gross hematuria. Urinary tract infections and
urinary retention.

EXAM:
CT ABDOMEN AND PELVIS WITHOUT AND WITH CONTRAST
TECHNIQUE: Multidetector CT imaging of the abdomen and pelvis was performed
following the standard protocol before and following the bolus
administration of intravenous contrast.
CONTRAST:  125 mL Omnipaque 300

[Series 2: w/o supine 5.0 · axial · non-contrast · 0.68mm/px · z∈[+1044,+1339]mm · 7 of 79 slices shown, 12 images]
[im 10/79  soft-tissue]
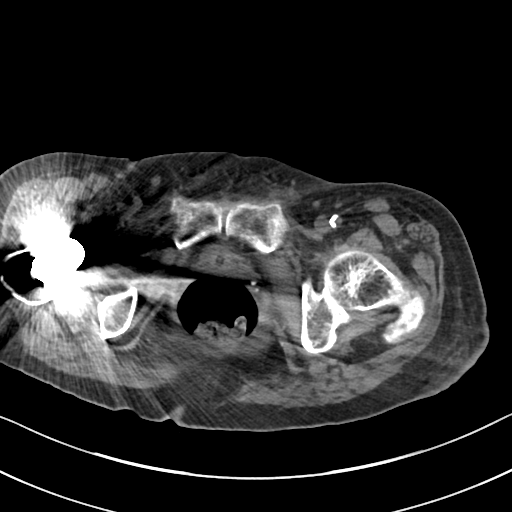
[im 10/79  bone]
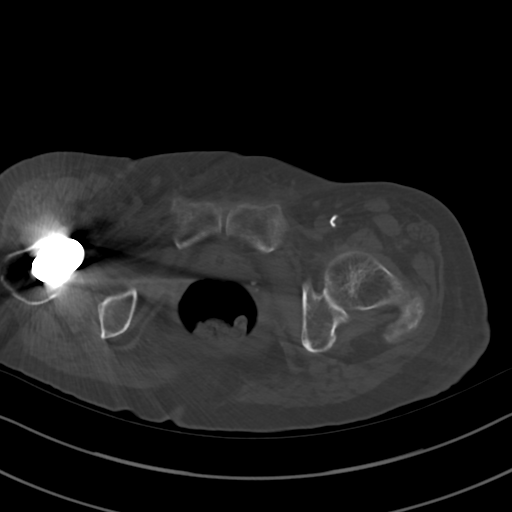
[im 20/79  soft-tissue]
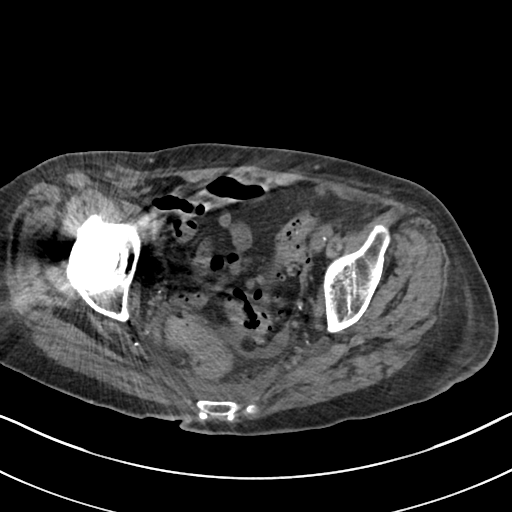
[im 30/79  soft-tissue]
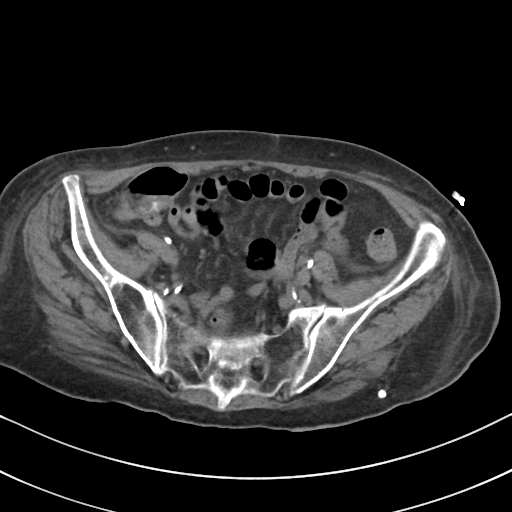
[im 40/79  soft-tissue]
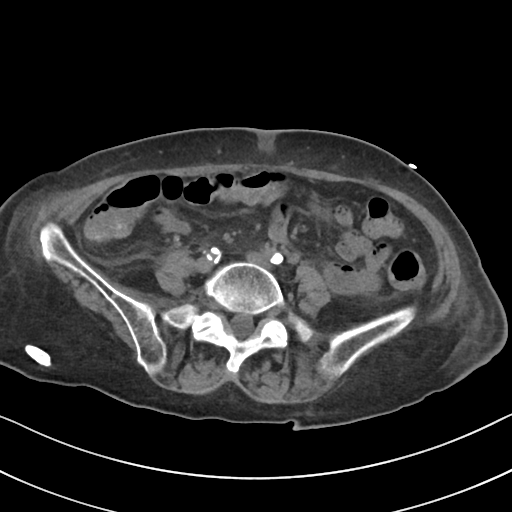
[im 40/79  lung]
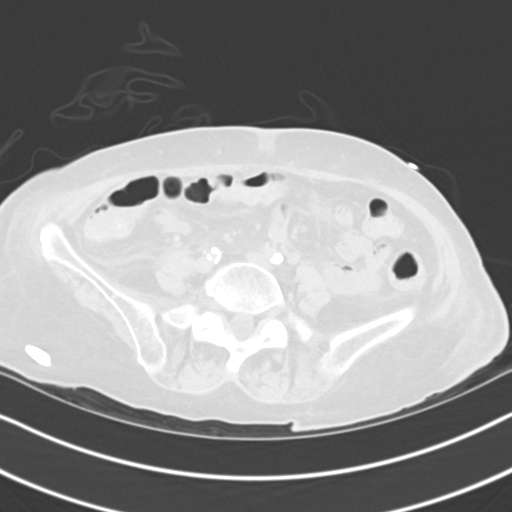
[im 49/79  soft-tissue]
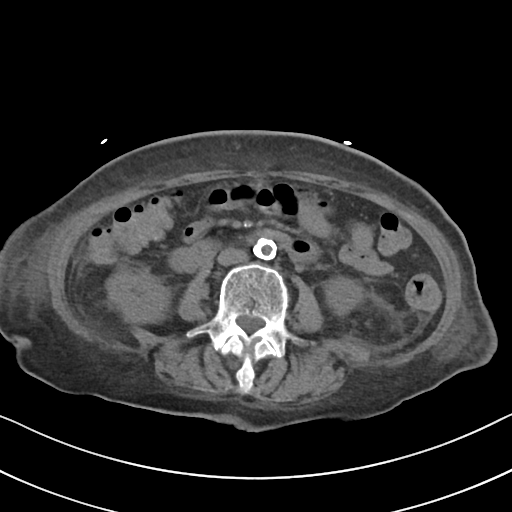
[im 49/79  lung]
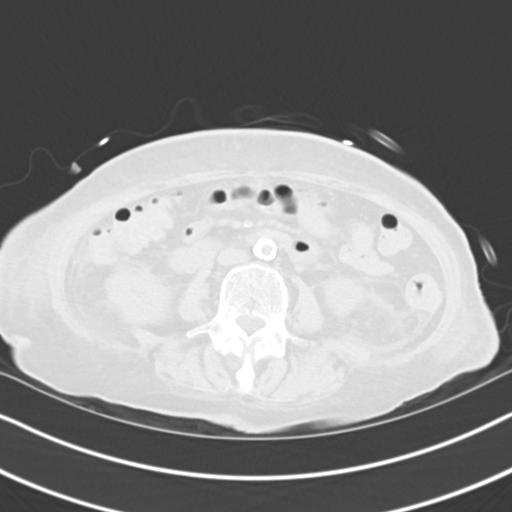
[im 59/79  soft-tissue]
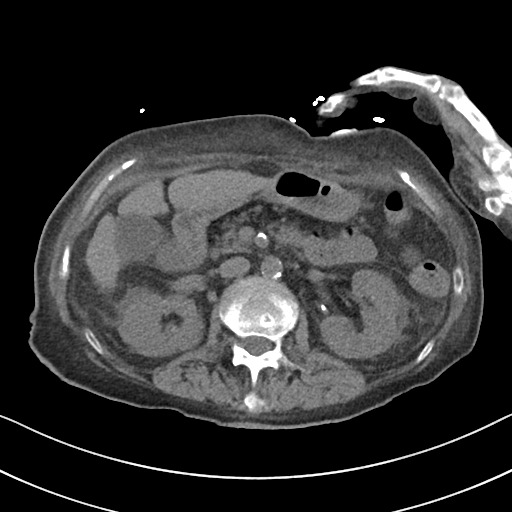
[im 59/79  lung]
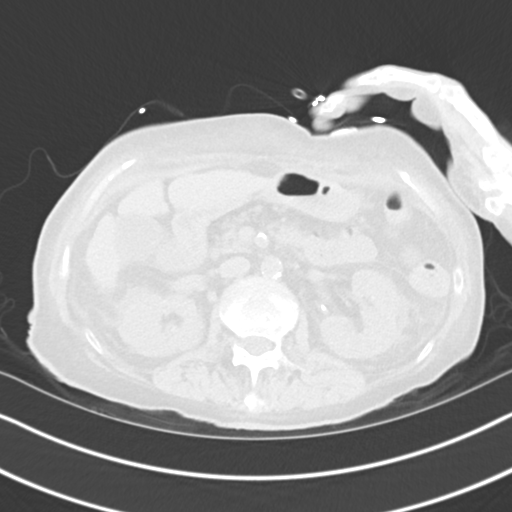
[im 69/79  soft-tissue]
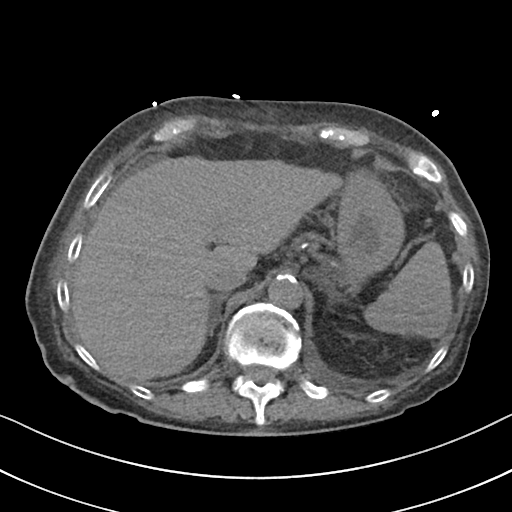
[im 69/79  lung]
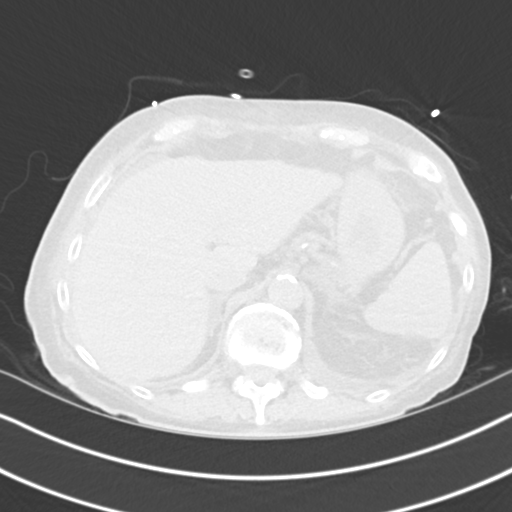

[Series 5: with supine 5.0 · axial · 0.68mm/px · z∈[+1056,+1106]mm · 2 of 79 slices shown]
[im 10/79  soft-tissue]
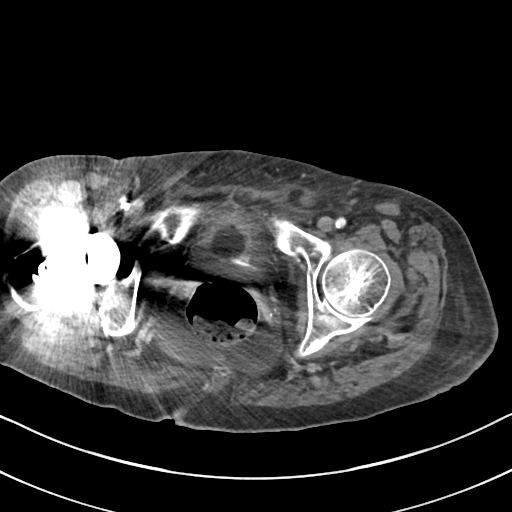
[im 20/79  soft-tissue]
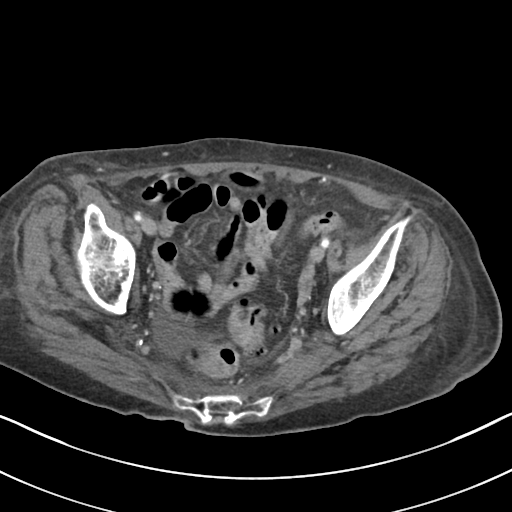

[Series 17: cor · coronal · 0.63mm/px · 2 of 187 slices shown, 3 images]
[im 63/187  soft-tissue]
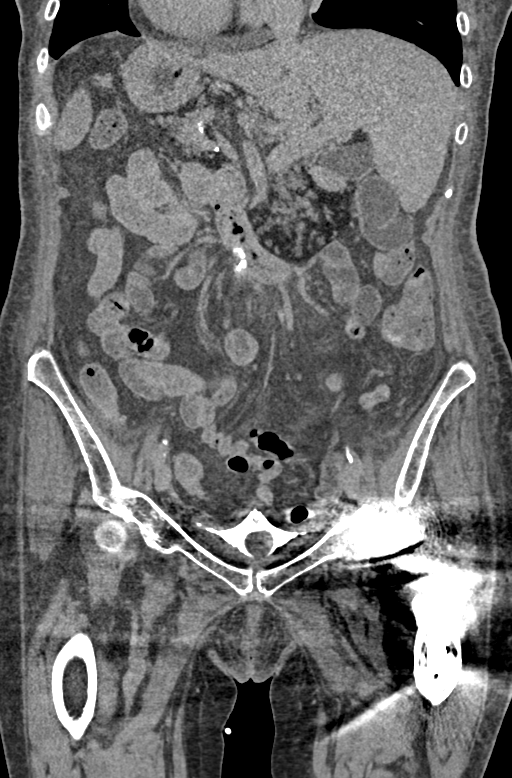
[im 63/187  bone]
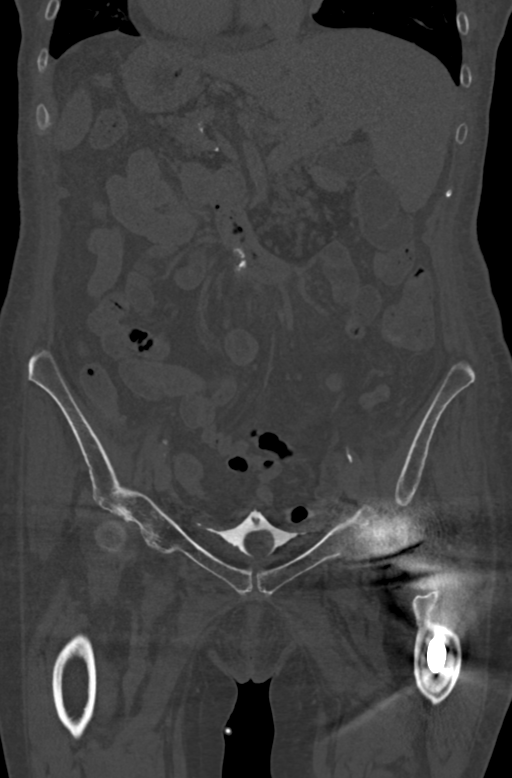
[im 125/187  soft-tissue]
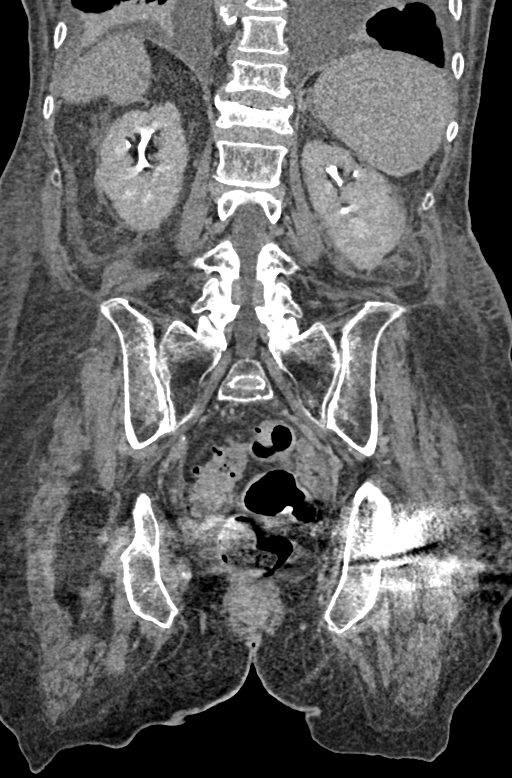

[11 of 46 positions shown; findings below may reference images not displayed]

FINDINGS: Lower chest: Small bilateral pleural effusions and bibasilar
atelectasis.

Hepatobiliary: Mild capsular nodularity and enlargement of left
hepatic lobe is suspicious for cirrhosis. No liver masses are
identified. Gallbladder is unremarkable.

Pancreas: No mass, inflammatory changes, or other significant
abnormality.

Spleen: Within normal limits in size and appearance.

Adrenals/Urinary Tract: Small low-attenuation bilateral adrenal
adenomas are noted. Small renal calculi seen bilaterally, however
there is no evidence of ureteral calculi or hydronephrosis.
Bilateral renal parenchymal scarring is noted, right side greater
than left. No evidence of renal masses. No masses seen involving the
lower urinary tract. Foley catheter is seen within the urinary
bladder which is nearly completely empty.

Stomach/Bowel: No evidence of obstruction, inflammatory process, or
abnormal fluid collections. Diverticulosis seen predominately
involving the sigmoid colon. No evidence of diverticulitis.

Vascular/Lymphatic: No pathologically enlarged lymph nodes. No
evidence of abdominal aortic aneurysm.

Reproductive: Prior hysterectomy noted. Adnexal regions are
unremarkable in appearance.

Other: Small amount of free fluid seen within the pelvis as well as
diffuse mesenteric and body wall edema.

Musculoskeletal: No suspicious bone lesions identified. Right hip
prosthesis results in artifact through the lower pelvis. Old T12 and
L1 vertebral body compression fracture deformities again noted.
IMPRESSION: Bilateral nonobstructive nephrolithiasis and renal parenchymal
scarring. No evidence of ureteral calculi or hydronephrosis. No
evidence of urinary tract carcinoma.

Bilateral benign adrenal adenomas.

Suspect hepatic cirrhosis. Mild ascites, small bilateral pleural
effusions, and diffuse body wall edema also noted.

Colonic diverticulosis. No radiographic evidence of diverticulitis.

## 2016-09-07 IMAGING — CT CT HEAD W/O CM
1 of 2 series · 15 of 30 positions shown, 19 images · non-contrast
Comparison: 11/06/2015

CLINICAL DATA: Recent discharge from [facility].
Multiple falls since discharge. Patient has struck head every time
but denies loss of consciousness. Dizziness upon standing and
walking. Loss of appetite. Urinary retention. Nausea.

EXAM:
CT HEAD WITHOUT CONTRAST
TECHNIQUE: Contiguous axial images were obtained from the base of the skull
through the vertex without intravenous contrast.

[Series 3: headtrauma 2.4 h60s · axial · 0.43mm/px · z∈[+92,+222]mm · 15 of 60 slices shown, 19 images]
[im 4/60  brain]
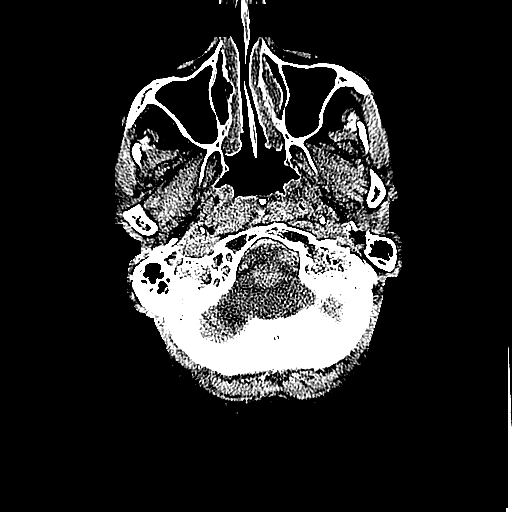
[im 4/60  bone]
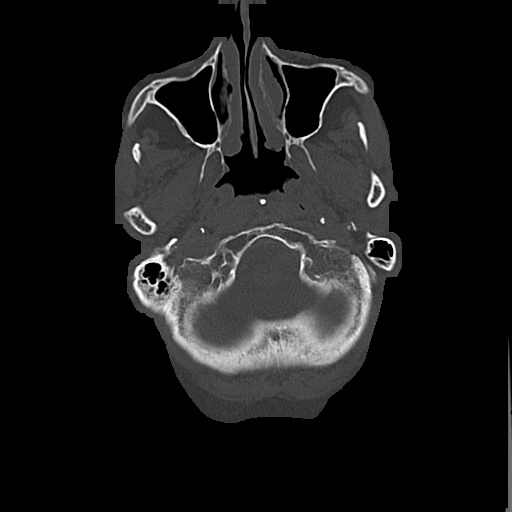
[im 7/60  brain]
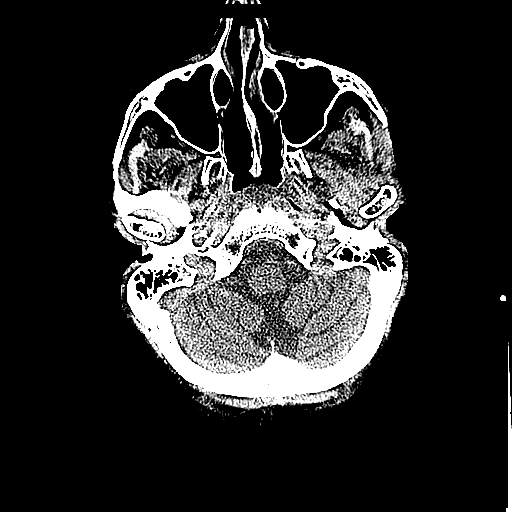
[im 13/60  brain]
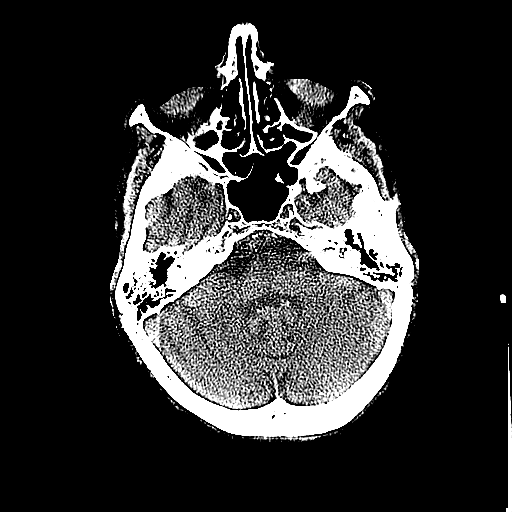
[im 16/60  brain]
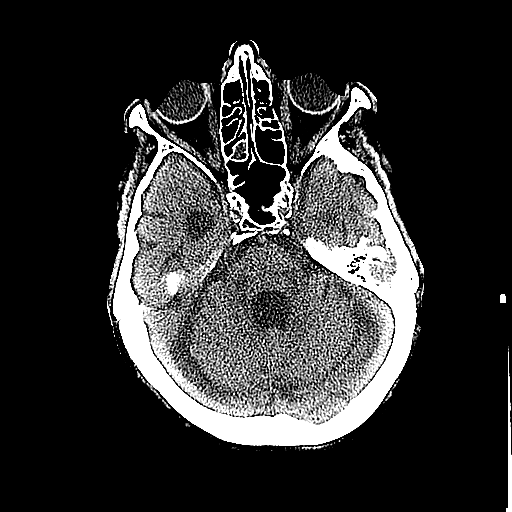
[im 19/60  brain]
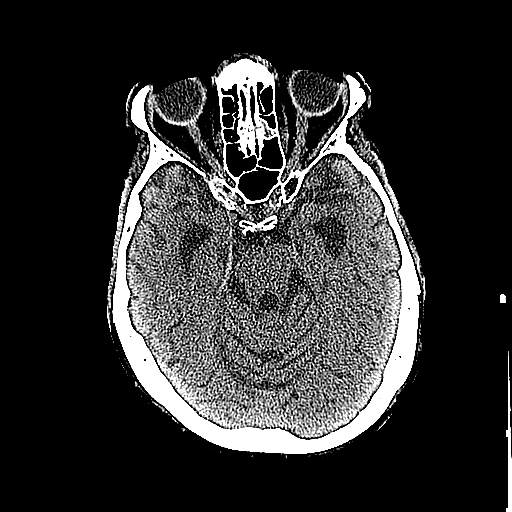
[im 19/60  bone]
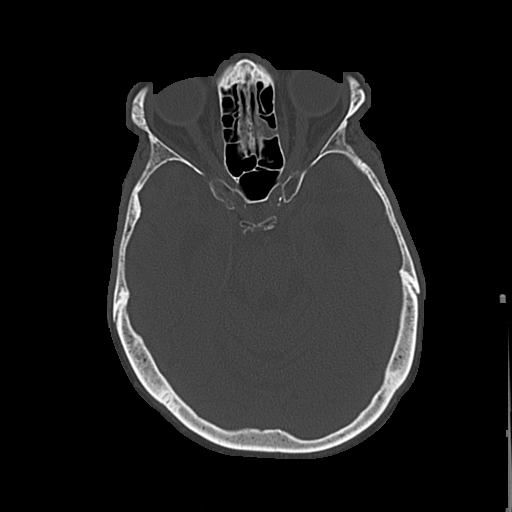
[im 22/60  brain]
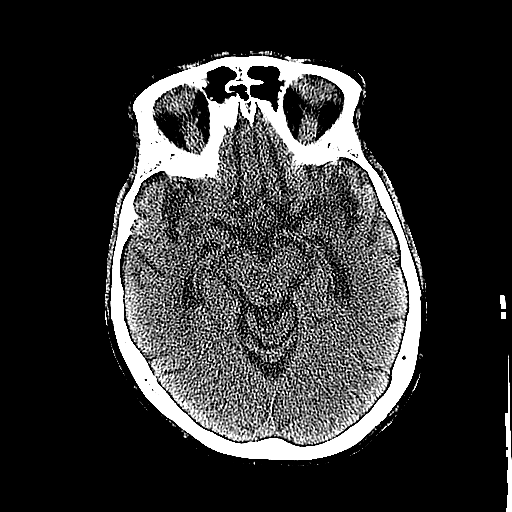
[im 25/60  brain]
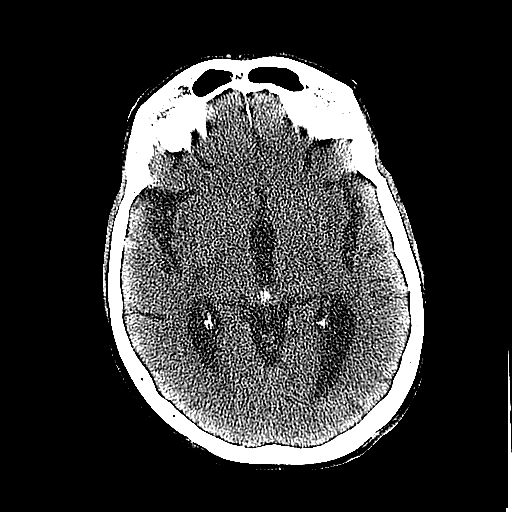
[im 32/60  brain]
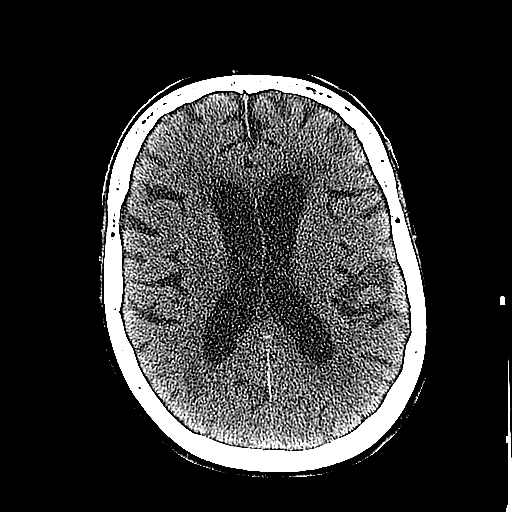
[im 35/60  brain]
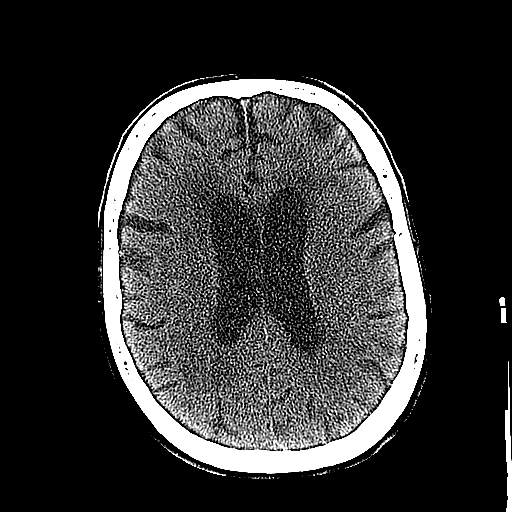
[im 35/60  bone]
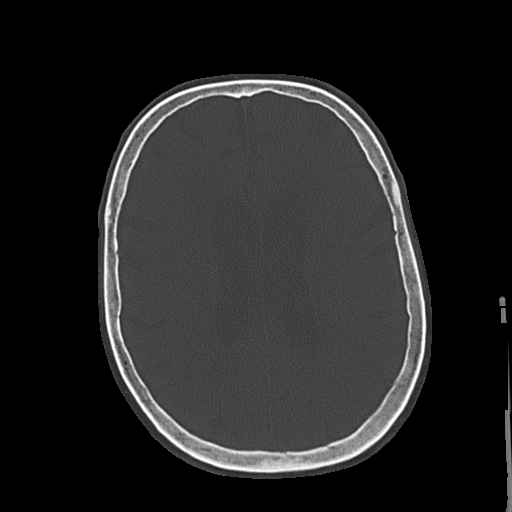
[im 38/60  brain]
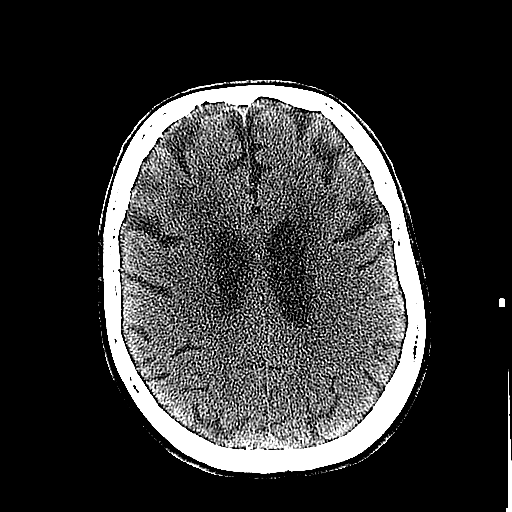
[im 41/60  brain]
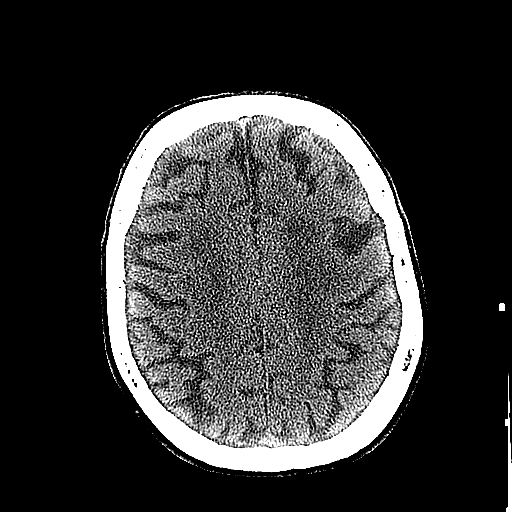
[im 44/60  brain]
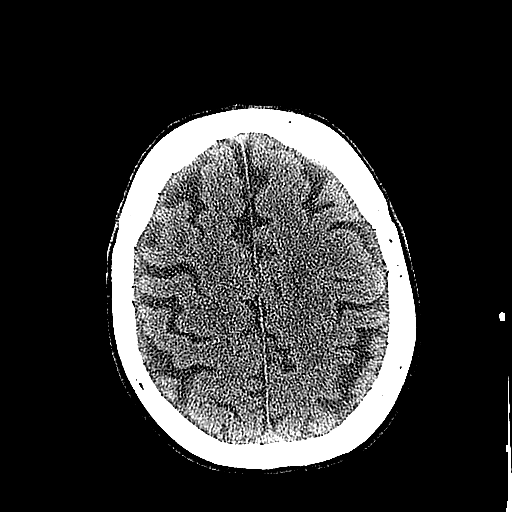
[im 50/60  brain]
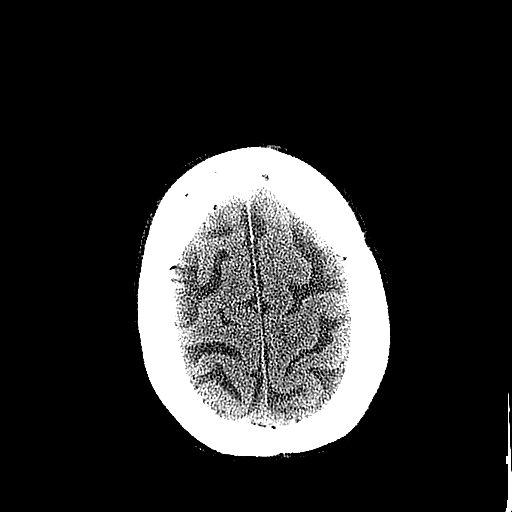
[im 50/60  bone]
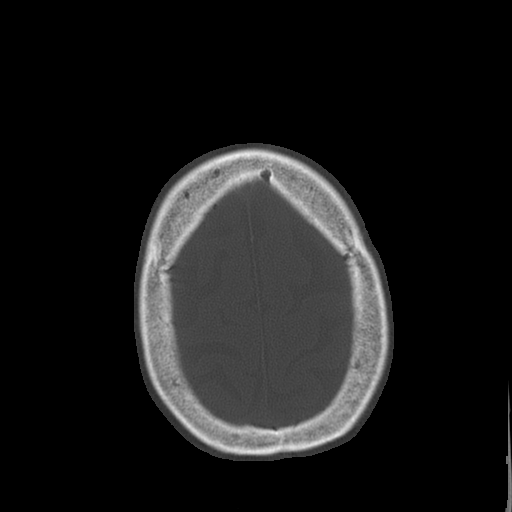
[im 53/60  brain]
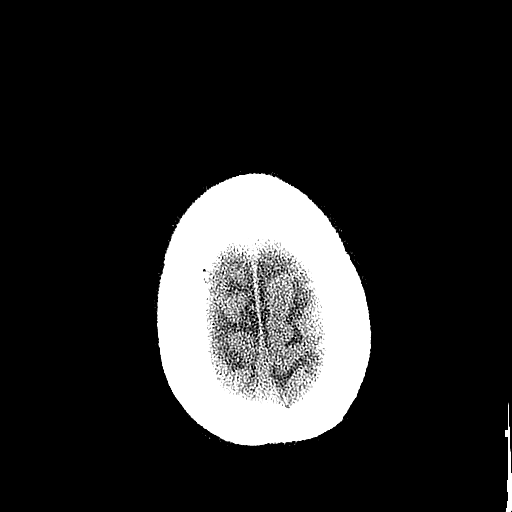
[im 56/60  brain]
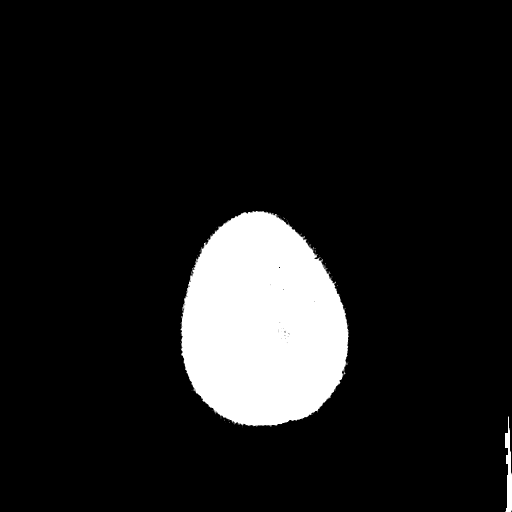

[15 of 30 positions shown; findings below may reference images not displayed]

FINDINGS: Mild diffuse cerebral atrophy. Mild ventricular dilatation
consistent with central atrophy. Low-attenuation changes in the deep
white matter consistent with small vessel ischemia. Focal
low-attenuation in the pons also likely ischemic. No change in
parenchymal pattern since the previous study. No mass effect or
midline shift. No abnormal extra-axial fluid collections. Gray-white
matter junctions are distinct. Basal cisterns are not effaced. No
evidence of acute intracranial hemorrhage. No depressed skull
fractures. Opacification of several ethmoid air cells. Visualized
paranasal sinuses are otherwise clear. Mastoid air cells are not
opacified. Vascular calcifications.
IMPRESSION: No acute intracranial abnormalities. Chronic atrophy and small
vessel ischemic changes similar prior study.
